# Patient Record
Sex: Female | Born: 1975
Health system: Southern US, Community
[De-identification: ages and names within clinical notes are randomized; demographics above are authoritative.]

## PROBLEM LIST (undated history)

## (undated) DIAGNOSIS — E559 Vitamin D deficiency, unspecified: Secondary | ICD-10-CM

## (undated) DIAGNOSIS — M1990 Unspecified osteoarthritis, unspecified site: Secondary | ICD-10-CM

## (undated) DIAGNOSIS — N816 Rectocele: Secondary | ICD-10-CM

## (undated) DIAGNOSIS — T7840XA Allergy, unspecified, initial encounter: Secondary | ICD-10-CM

## (undated) HISTORY — DX: Rectocele: N81.6

## (undated) HISTORY — DX: Unspecified osteoarthritis, unspecified site: M19.90

## (undated) HISTORY — PX: REPAIR ANKLE LIGAMENT: SUR1187

## (undated) HISTORY — DX: Vitamin D deficiency, unspecified: E55.9

## (undated) HISTORY — DX: Allergy, unspecified, initial encounter: T78.40XA

---

## 2016-12-01 DIAGNOSIS — Z01419 Encounter for gynecological examination (general) (routine) without abnormal findings: Secondary | ICD-10-CM | POA: Diagnosis not present

## 2016-12-20 DIAGNOSIS — N6310 Unspecified lump in the right breast, unspecified quadrant: Secondary | ICD-10-CM | POA: Diagnosis not present

## 2017-01-12 DIAGNOSIS — Z0000 Encounter for general adult medical examination without abnormal findings: Secondary | ICD-10-CM | POA: Diagnosis not present

## 2017-01-12 DIAGNOSIS — Z13220 Encounter for screening for lipoid disorders: Secondary | ICD-10-CM | POA: Diagnosis not present

## 2017-01-12 DIAGNOSIS — R1011 Right upper quadrant pain: Secondary | ICD-10-CM | POA: Diagnosis not present

## 2017-01-12 DIAGNOSIS — Z23 Encounter for immunization: Secondary | ICD-10-CM | POA: Diagnosis not present

## 2017-01-12 DIAGNOSIS — R1010 Upper abdominal pain, unspecified: Secondary | ICD-10-CM | POA: Diagnosis not present

## 2017-02-11 DIAGNOSIS — B370 Candidal stomatitis: Secondary | ICD-10-CM | POA: Diagnosis not present

## 2017-06-13 DIAGNOSIS — Z23 Encounter for immunization: Secondary | ICD-10-CM | POA: Diagnosis not present

## 2017-07-21 DIAGNOSIS — D649 Anemia, unspecified: Secondary | ICD-10-CM | POA: Diagnosis not present

## 2017-07-21 DIAGNOSIS — R1011 Right upper quadrant pain: Secondary | ICD-10-CM | POA: Diagnosis not present

## 2017-07-25 ENCOUNTER — Other Ambulatory Visit: Payer: Self-pay | Admitting: Family Medicine

## 2017-07-25 DIAGNOSIS — E041 Nontoxic single thyroid nodule: Secondary | ICD-10-CM

## 2017-07-25 DIAGNOSIS — R1011 Right upper quadrant pain: Secondary | ICD-10-CM

## 2017-07-28 ENCOUNTER — Ambulatory Visit
Admission: RE | Admit: 2017-07-28 | Discharge: 2017-07-28 | Disposition: A | Discharge status: Home / Self Care | Payer: 59 | Source: Ambulatory Visit | Attending: Family Medicine | Admitting: Family Medicine

## 2017-07-28 DIAGNOSIS — R1011 Right upper quadrant pain: Secondary | ICD-10-CM

## 2017-08-25 DIAGNOSIS — D649 Anemia, unspecified: Secondary | ICD-10-CM | POA: Diagnosis not present

## 2017-10-02 DIAGNOSIS — D649 Anemia, unspecified: Secondary | ICD-10-CM | POA: Diagnosis not present

## 2018-01-01 IMAGING — US US ABDOMEN LIMITED
1 series · 14 of 25 positions shown · non-contrast
Comparison: None.

CLINICAL DATA: Right upper quadrant pain over the last 8 months.

EXAM:
ULTRASOUND ABDOMEN LIMITED RIGHT UPPER QUADRANT

[Series 1: us abdomen limited · 0.20mm/px · 14 of 43 slices shown]
[im 1/43]
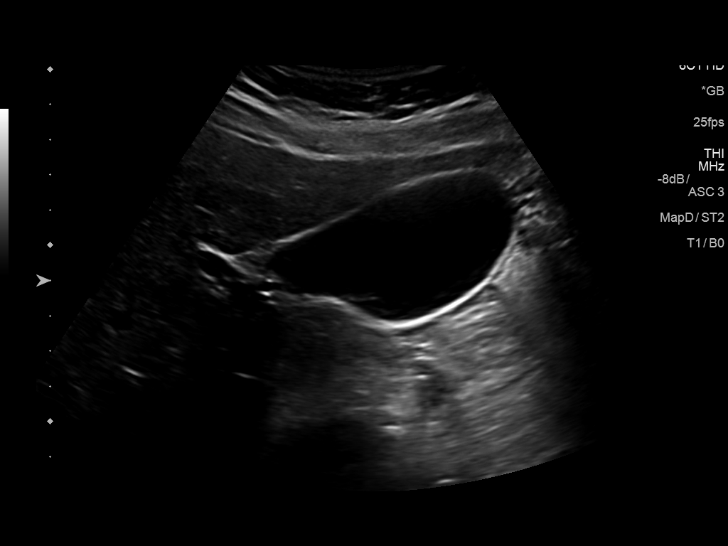
[im 4/43]
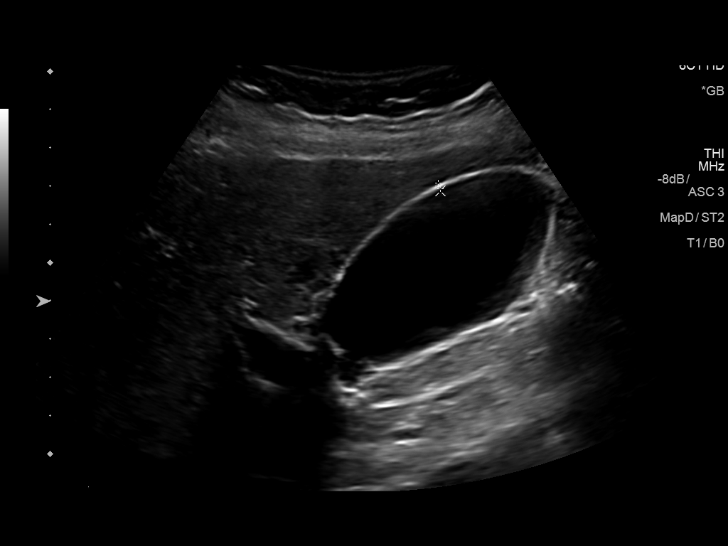
[im 8/43]
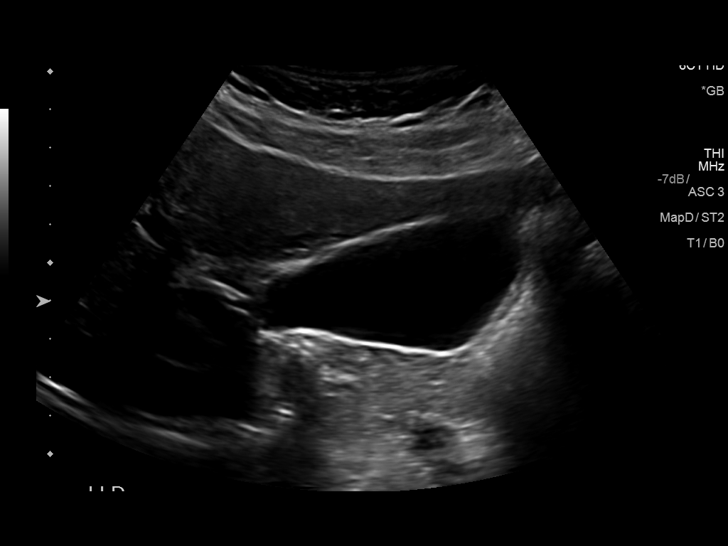
[im 11/43]
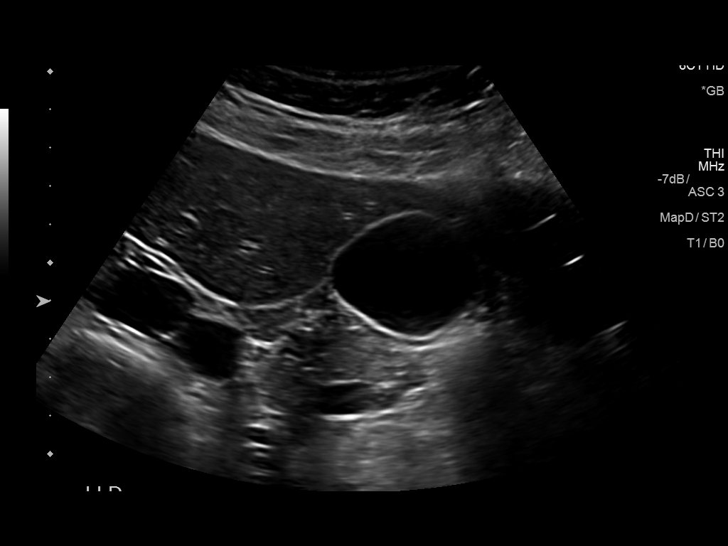
[im 15/43]
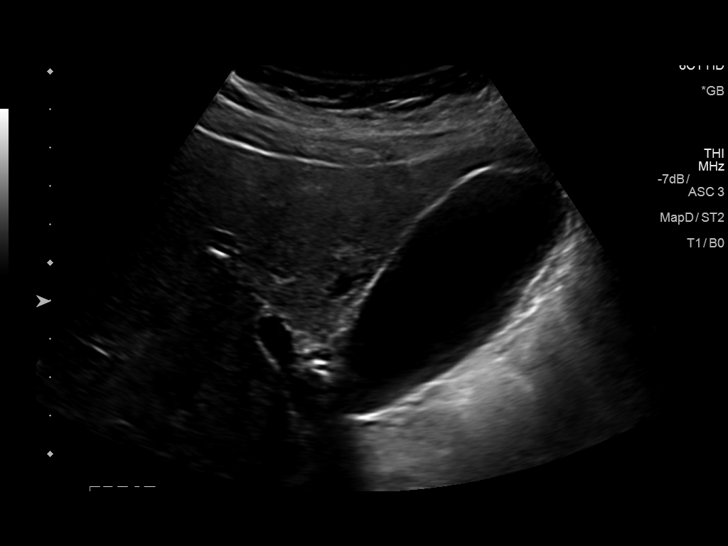
[im 16/43]
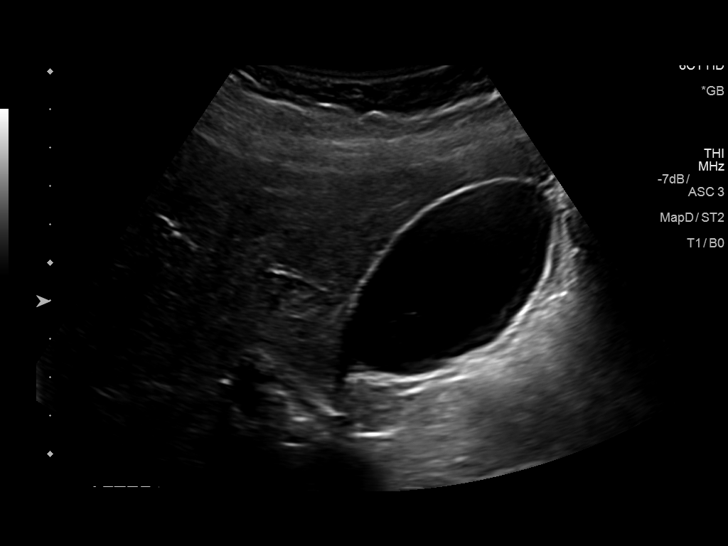
[im 20/43]
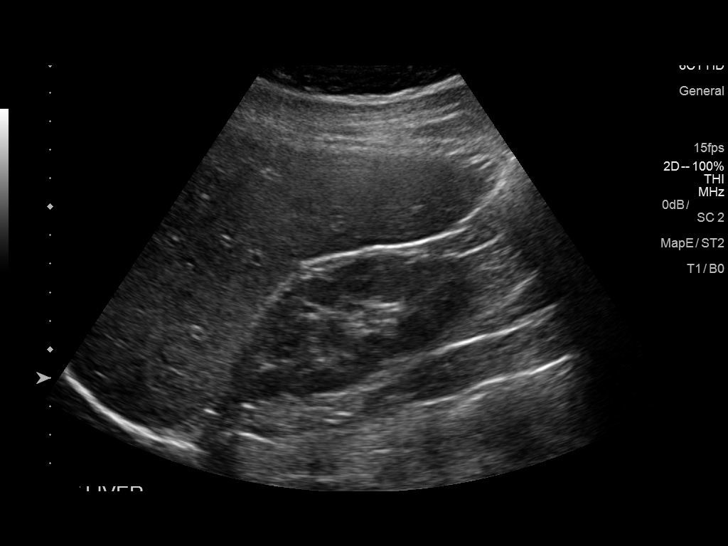
[im 23/43]
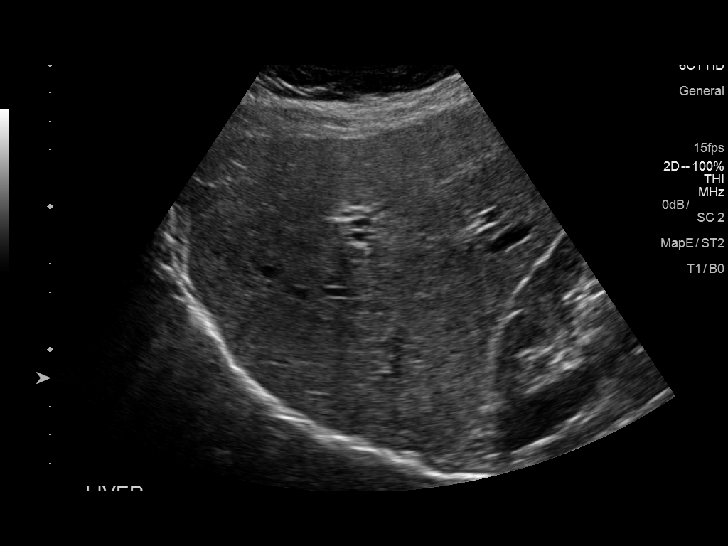
[im 27/43]
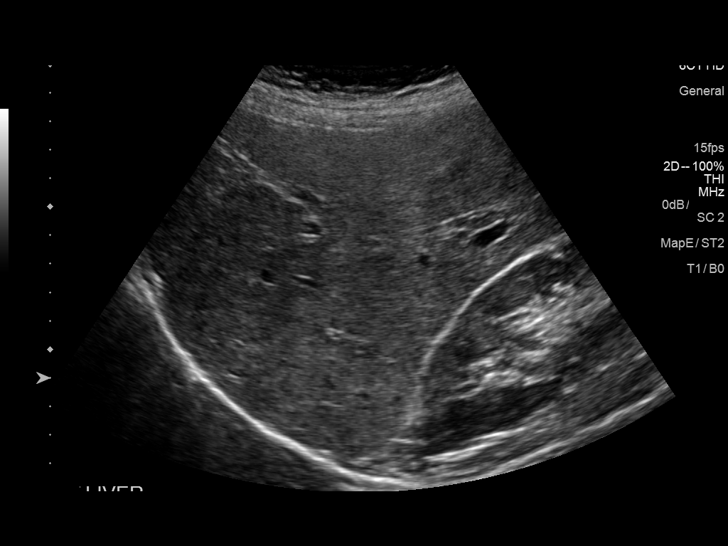
[im 29/43]
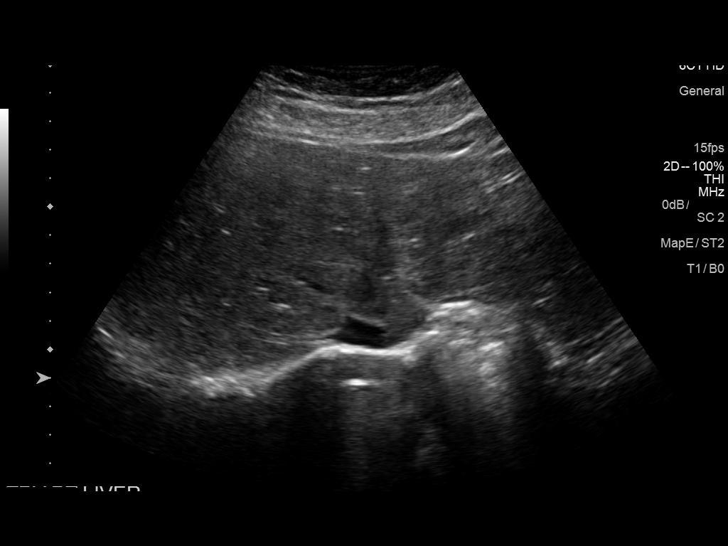
[im 32/43]
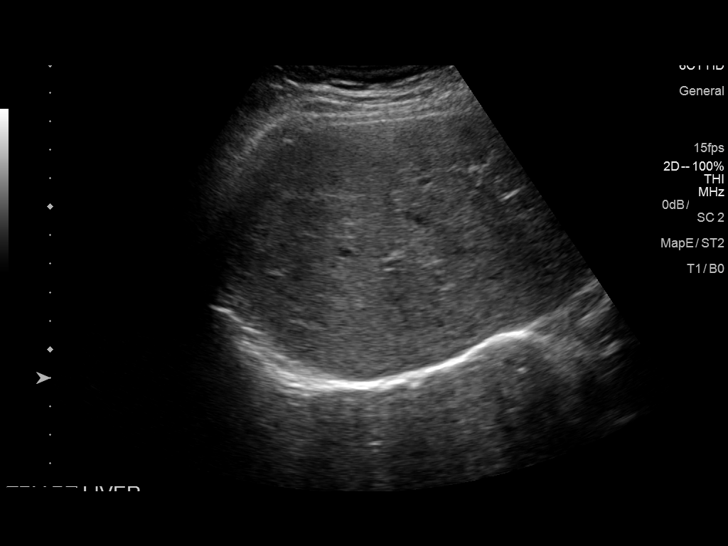
[im 36/43]
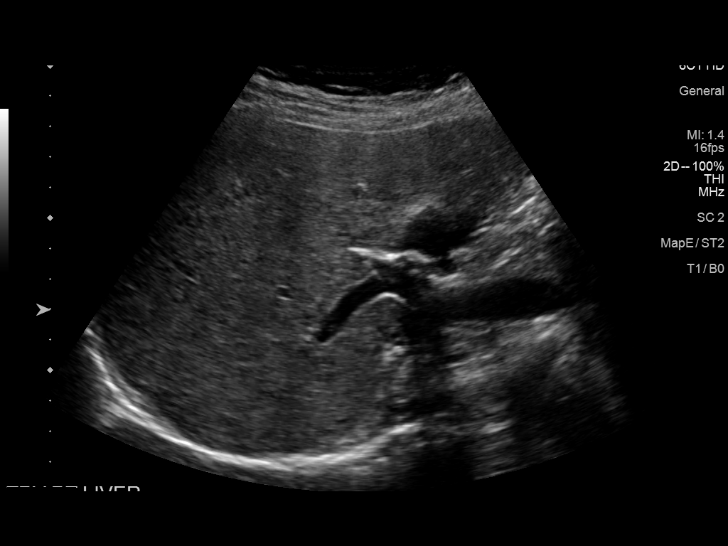
[im 39/43]
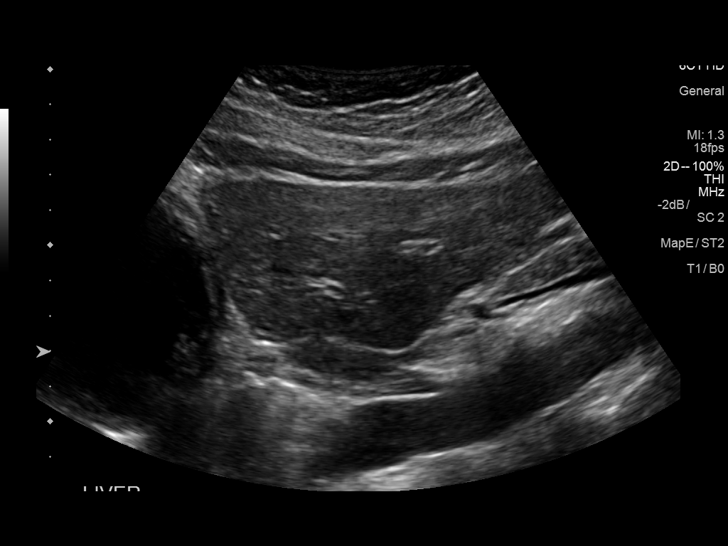
[im 43/43]
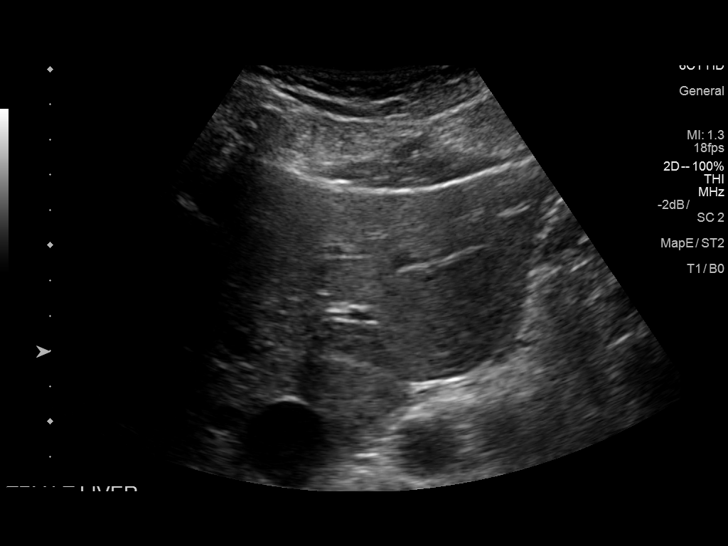

[14 of 25 positions shown; findings below may reference images not displayed]

FINDINGS: Gallbladder:

No gallstones or wall thickening visualized. No sonographic Murphy
sign noted by sonographer.

Common bile duct:

Diameter: 3 mm common normal

Liver:

No focal lesion identified. Within normal limits in parenchymal
echogenicity. Portal vein is patent on color Doppler imaging with
normal direction of blood flow towards the liver.
IMPRESSION: Normal right upper quadrant ultrasound. No cause of pain identified.

## 2018-01-02 DIAGNOSIS — Z01419 Encounter for gynecological examination (general) (routine) without abnormal findings: Secondary | ICD-10-CM | POA: Diagnosis not present

## 2018-01-17 DIAGNOSIS — Z0000 Encounter for general adult medical examination without abnormal findings: Secondary | ICD-10-CM | POA: Diagnosis not present

## 2018-01-17 DIAGNOSIS — D649 Anemia, unspecified: Secondary | ICD-10-CM | POA: Diagnosis not present

## 2018-01-17 DIAGNOSIS — Z13220 Encounter for screening for lipoid disorders: Secondary | ICD-10-CM | POA: Diagnosis not present

## 2018-08-07 DIAGNOSIS — R05 Cough: Secondary | ICD-10-CM | POA: Diagnosis not present

## 2018-08-07 DIAGNOSIS — J0100 Acute maxillary sinusitis, unspecified: Secondary | ICD-10-CM | POA: Diagnosis not present

## 2018-08-24 ENCOUNTER — Other Ambulatory Visit: Payer: Self-pay | Admitting: Family Medicine

## 2018-09-19 NOTE — Progress Notes (Signed)
Sharon ScaleZach West D.O. [facility] [address] [place], [place] [address] Phone: [phone] Subjective:   Sharon West, Sharon West, am serving as a scribe for Dr. Antoine PrimasZachary West.  I'm seeing this patient by the request  of:    CC: Right foot and heel pain  VHQ:[id number]:Subjective  Loistine SimasJohanna West is a 43 y.o. female coming in with complaint of right heel pain for one year. Patient began to play tennis and acquired pain. Pain is localized. Has been stretching gastroc, uses OOFOS in the house, rolls the fascia, and is using OTC orthotics. Has not been active due to pain but has tried walking around neighborhood. Does have swelling in the area if she has been on her feet. Tries not to use IBU.       History reviewed. No pertinent past medical history. History reviewed. No pertinent surgical history. Social History   Socioeconomic History  . Marital status: Married    Spouse name: Not on file  . Number of children: Not on file  . Years of education: Not on file  . Highest education level: Not on file  Occupational History  . Not on file  Social Needs  . Financial resource strain: Not on file  . Food insecurity:    Worry: Not on file    Inability: Not on file  . Transportation needs:    Medical: Not on file    Non-medical: Not on file  Tobacco Use  . Smoking status: Not on file  Substance and Sexual Activity  . Alcohol use: Not on file  . Drug use: Not on file  . Sexual activity: Not on file  Lifestyle  . Physical activity:    Days per week: Not on file    Minutes per session: Not on file  . Stress: Not on file  Relationships  . Social connections:    Talks on phone: Not on file    Gets together: Not on file    Attends religious service: Not on file    Active member of club or organization: Not on file    Attends meetings of clubs or organizations: Not on file    Relationship status: Not on file  Other Topics Concern  . Not on file  Social History Narrative  . Not on file    Not on File History reviewed. No pertinent family history.   Current Outpatient Medications (Cardiovascular):  .  nitroGLYCERIN (NITRODUR - DOSED IN MG/24 HR) 0.2 mg/hr patch, 1/4 patch daily     Current Outpatient Medications (Other):  Marland Kitchen.  Vitamin D, Cholecalciferol, 25 MCG (1000 UT) TABS, Take by mouth. .  Vitamin D, Ergocalciferol, (DRISDOL) 1.25 MG (50000 UT) CAPS capsule, Take 1 capsule (50,000 Units total) by mouth every 7 (seven) days.    Past medical history, social, surgical and family history all reviewed in electronic medical record.  No pertanent information unless stated regarding to the chief complaint.   Review of Systems:  No headache, visual changes, nausea, vomiting, diarrhea, constipation, dizziness, abdominal pain, skin rash, fevers, chills, night sweats, weight loss, swollen lymph nodes, body aches, joint swelling, , chest pain, shortness of breath, mood changes.  Positive muscle aches  Objective  Blood pressure 112/74, pulse 80, height 5\' 6"  (1.676 m), weight 179 lb (81.2 kg), SpO2 98 %.   General: No apparent distress alert and oriented x3 mood and affect normal, dressed appropriately.  HEENT: Pupils equal, extraocular movements intact  Respiratory: Patient's speak in full sentences and does  not appear short of breath  Cardiovascular: No lower extremity edema, non tender, no erythema  Skin: Warm dry intact with no signs of infection or rash on extremities or on axial skeleton.  Abdomen: Soft nontender  Neuro: Cranial nerves II through XII are intact, neurovascularly intact in all extremities with 2+ DTRs and 2+ pulses.  Lymph: No lymphadenopathy of posterior or anterior cervical chain or axillae bilaterally.  Gait normal with good balance and coordination.  MSK:  Non tender with full range of motion and good stability and symmetric strength and tone of shoulders, elbows, wrist, hip, knee bilaterally.  Ankle: Right No visible erythema or swelling. Range of  motion is full in all directions. Strength is 5/5 in all directions. Stable lateral and medial ligaments; squeeze test and kleiger test unremarkable; Talar dome nontender; No pain at base of 5th MT; No tenderness over cuboid; No tenderness over N spot or navicular prominence No tenderness on posterior aspects of lateral and medial malleolus No sign of peroneal tendon subluxations or tenderness to palpation Mild positive tarsal tunnel tinel's Able to walk 4 steps.  MSK US performed of: Right ankle This study was ordered, performed, and interpreted by Sharon West D.O.  Foot/Ankle:   All structures visualized.   Ankle mortise is unremarkable.  Posterior tibialis very trace effusion within the tendon sheath. Patient does have what appears to be a small stress reaction versus possible small cortical defect with callus feeling over the calcaneal region on the plantar aspect.  Mild enlargement of the plantar fascial knee noted.  Possible partial tear of the plantar fascia also noted  IMPRESSION: calcanealstress fracture    Impression and Recommendations:     This case required medical decision making of moderate complexity. The above documentation has been reviewed and is accurate and complete Sharon SaaZachary M Smith, DO       Note: This dictation was prepared with Dragon dictation along with smaller phrase technology. Any transcriptional errors that result from this process are unintentional.

## 2018-09-20 ENCOUNTER — Encounter: Payer: Self-pay | Admitting: Family Medicine

## 2018-09-20 ENCOUNTER — Ambulatory Visit: Payer: Self-pay

## 2018-09-20 ENCOUNTER — Ambulatory Visit (INDEPENDENT_AMBULATORY_CARE_PROVIDER_SITE_OTHER): Payer: 59 | Admitting: Family Medicine

## 2018-09-20 VITALS — BP 112/74 | HR 80 | Ht 66.0 in | Wt 179.0 lb

## 2018-09-20 DIAGNOSIS — S92001A Unspecified fracture of right calcaneus, initial encounter for closed fracture: Secondary | ICD-10-CM

## 2018-09-20 DIAGNOSIS — M79671 Pain in right foot: Secondary | ICD-10-CM | POA: Diagnosis not present

## 2018-09-20 MED ORDER — VITAMIN D (ERGOCALCIFEROL) 1.25 MG (50000 UNIT) PO CAPS: 50000.0000 [IU] | ORAL_CAPSULE | ORAL | 0 refills | Status: DC

## 2018-09-20 MED ORDER — NITROGLYCERIN 0.2 MG/HR TD PT24: MEDICATED_PATCH | TRANSDERMAL | 1 refills | Status: DC

## 2018-09-20 NOTE — Assessment & Plan Note (Signed)
Appears to be more of a calcaneal fracture.  Seems to be more of a stress reaction.  Does have some healing.  Start vitamin D, discussed over-the-counter orthotics, discussed a heel doughnut.  We discussed avoiding high impact exercises for now.  Start nitroglycerin avoid potential side effects including headaches.  Follow-up again in 4 weeks

## 2018-09-20 NOTE — Patient Instructions (Signed)
God to see you  Ice is yrou friend if you want but not more then 10 minutes at a time Avoid being barefoot.  Spenco orthotics "total support" online would be great  Once weekly vitamin D for 12 weeks, stop daily  Get K2 over the counter and take daily for 1 month Nitroglycerin Protocol   Apply 1/4 nitroglycerin patch to affected area daily.  Change position of patch within the affected area every 24 hours.  You may experience a headache during the first 1-2 weeks of using the patch, these should subside.  If you experience headaches after beginning nitroglycerin patch treatment, you may take your preferred over the counter pain reliever.  Another side effect of the nitroglycerin patch is skin irritation or rash related to patch adhesive.  Please notify our office if you develop more severe headaches or rash, and stop the patch.  Tendon healing with nitroglycerin patch may require 12 to 24 weeks depending on the extent of injury.  Men should not use if taking Viagra, Cialis, or Levitra.   Do not use if you have migraines or rosacea.  Heel donut could be nice as well  We will watch the nerve as well  See me again in 4 weeks

## 2018-10-18 ENCOUNTER — Ambulatory Visit: Payer: Self-pay

## 2018-10-18 ENCOUNTER — Encounter: Payer: Self-pay | Admitting: Family Medicine

## 2018-10-18 ENCOUNTER — Ambulatory Visit (INDEPENDENT_AMBULATORY_CARE_PROVIDER_SITE_OTHER): Payer: 59 | Admitting: Family Medicine

## 2018-10-18 ENCOUNTER — Other Ambulatory Visit: Payer: Self-pay

## 2018-10-18 VITALS — BP 102/64 | HR 86 | Ht 66.0 in | Wt 178.0 lb

## 2018-10-18 DIAGNOSIS — S92001D Unspecified fracture of right calcaneus, subsequent encounter for fracture with routine healing: Secondary | ICD-10-CM

## 2018-10-18 DIAGNOSIS — M79671 Pain in right foot: Secondary | ICD-10-CM

## 2018-10-18 MED ORDER — DICLOFENAC SODIUM 2 % TD SOLN: 2.0000 g | Freq: Two times a day (BID) | TRANSDERMAL | 3 refills | Status: DC

## 2018-10-18 MED ORDER — VITAMIN D (ERGOCALCIFEROL) 1.25 MG (50000 UNIT) PO CAPS: 50000.0000 [IU] | ORAL_CAPSULE | ORAL | 0 refills | Status: DC

## 2018-10-18 NOTE — Assessment & Plan Note (Signed)
Healing noted, patient does have signs and symptoms that are consistent with some tarsal tunnel syndrome, seems to be more compression of a varicose vein, we discussed compression socks, home exercises, discussed topical anti-inflammatories.  Discussed burst use of anti-inflammatories when needed.  Icing regimen, patient will try to continue to make these changes and see me again in 6 weeks

## 2018-10-18 NOTE — Progress Notes (Signed)
[name] [address] [place], [place] [address] Phone: [phone] Subjective:     CC: Foot pain follow-up  OVF:[id number]   I, Wilford Grist, am serving as a scribe for Dr. Antoine Primas.   10/18/2018: Appears to be more of a calcaneal fracture.  Seems to be more of a stress reaction.  Does have some healing.  Start vitamin D, discussed over-the-counter orthotics, discussed a heel doughnut.  We discussed avoiding high impact exercises for now.  Start nitroglycerin avoid potential side effects including headaches.  Follow-up again in 4 weeks  Update 10/18/2018: Sharon West is a 43 y.o. female coming in with complaint of right heel pain. Patient states that her pain is less than last visit. Is having more nerve pain daily. Working out increases the burning in her arch. Does also note some swelling in that area. Did get new shoes and arch supports.       No past medical history on file. No past surgical history on file. Social History   Socioeconomic History  . Marital status: Married    Spouse name: Not on file  . Number of children: Not on file  . Years of education: Not on file  . Highest education level: Not on file  Occupational History  . Not on file  Social Needs  . Financial resource strain: Not on file  . Food insecurity:    Worry: Not on file    Inability: Not on file  . Transportation needs:    Medical: Not on file    Non-medical: Not on file  Tobacco Use  . Smoking status: Not on file  Substance and Sexual Activity  . Alcohol use: Not on file  . Drug use: Not on file  . Sexual activity: Not on file  Lifestyle  . Physical activity:    Days per week: Not on file    Minutes per session: Not on file  . Stress: Not on file  Relationships  . Social connections:    Talks on phone: Not on file    Gets together: Not on file    Attends religious service: Not on file    Active member of club or organization: Not on file   Attends meetings of clubs or organizations: Not on file    Relationship status: Not on file  Other Topics Concern  . Not on file  Social History Narrative  . Not on file   Not on File No family history on file.   Current Outpatient Medications (Cardiovascular):  .  nitroGLYCERIN (NITRODUR - DOSED IN MG/24 HR) 0.2 mg/hr patch, 1/4 patch daily     Current Outpatient Medications (Other):  Marland Kitchen  Diclofenac Sodium (PENNSAID) 2 % SOLN, Place 2 g onto the skin 2 (two) times daily. .  Vitamin D, Cholecalciferol, 25 MCG (1000 UT) TABS, Take by mouth. .  Vitamin D, Ergocalciferol, (DRISDOL) 1.25 MG (50000 UT) CAPS capsule, Take 1 capsule (50,000 Units total) by mouth every 7 (seven) days.    Past medical history, social, surgical and family history all reviewed in electronic medical record.  No pertanent information unless stated regarding to the chief complaint.   Review of Systems:  No headache, visual changes, nausea, vomiting, diarrhea, constipation, dizziness, abdominal pain, skin rash, fevers, chills, night sweats, weight loss, swollen lymph nodes, body aches, joint swelling, muscle aches, chest pain, shortness of breath, mood changes.  Positive muscle aches  Objective  Blood pressure 102/64, pulse 86, height 5'  6" (1.676 m), weight 178 lb (80.7 kg), SpO2 99 %.    General: No apparent distress alert and oriented x3 mood and affect normal, dressed appropriately.  HEENT: Pupils equal, extraocular movements intact  Respiratory: Patient's speak in full sentences and does not appear short of breath  Cardiovascular: No lower extremity edema, non tender, no erythema  Skin: Warm dry intact with no signs of infection or rash on extremities or on axial skeleton.  Abdomen: Soft nontender  Neuro: Cranial nerves II through XII are intact, neurovascularly intact in all extremities with 2+ DTRs and 2+ pulses.  Lymph: No lymphadenopathy of posterior or anterior cervical chain or axillae bilaterally.   Gait normal with good balance and coordination.  MSK:  Non tender with full range of motion and good stability and symmetric strength and tone of shoulders, elbows, wrist, hip, knee and ankles bilaterally.  Foot exam: Shows the patient does have very mild breakdown longitudinal arch.  Still mild tenderness over the calcaneal region, mild positive Tinel's over the tarsal tunnel on the right side.    Limited musculoskeletal ultrasound was performed and interpreted by Judi Saa   Limited ultrasound shows the patient's calcaneal region does have good callus formation noted.  Patient does have what appears to be some inflammation in the varicose vein that seems to transverse the tarsal tunnel that is likely contributing to some of the discomfort and pain. Impression and Recommendations:     This case required medical decision making of moderate complexity. The above documentation has been reviewed and is accurate and complete Judi Saa, DO       Note: This dictation was prepared with Dragon dictation along with smaller phrase technology. Any transcriptional errors that result from this process are unintentional.

## 2018-10-18 NOTE — Patient Instructions (Signed)
Varicose vein possibly contributing  The calcaneus is improving a lot Refill the vitami nD for at least another r4 week pennsaid pinkie amount topically 2 times daily as needed.   Compression socks 10-21mMhg Up to you on the nitro and listen to your body  Avoid being barefoot still another month  See me again in 6 weeks if not perfect

## 2018-12-04 ENCOUNTER — Ambulatory Visit: Payer: 59 | Admitting: Family Medicine

## 2019-01-08 DIAGNOSIS — Z1231 Encounter for screening mammogram for malignant neoplasm of breast: Secondary | ICD-10-CM | POA: Diagnosis not present

## 2019-01-25 DIAGNOSIS — D649 Anemia, unspecified: Secondary | ICD-10-CM | POA: Diagnosis not present

## 2019-01-25 DIAGNOSIS — M8430XA Stress fracture, unspecified site, initial encounter for fracture: Secondary | ICD-10-CM | POA: Diagnosis not present

## 2019-01-25 DIAGNOSIS — Z0000 Encounter for general adult medical examination without abnormal findings: Secondary | ICD-10-CM | POA: Diagnosis not present

## 2019-01-25 DIAGNOSIS — Z13220 Encounter for screening for lipoid disorders: Secondary | ICD-10-CM | POA: Diagnosis not present

## 2019-07-16 DIAGNOSIS — Z1151 Encounter for screening for human papillomavirus (HPV): Secondary | ICD-10-CM | POA: Diagnosis not present

## 2019-07-16 DIAGNOSIS — Z01419 Encounter for gynecological examination (general) (routine) without abnormal findings: Secondary | ICD-10-CM | POA: Diagnosis not present

## 2019-07-16 DIAGNOSIS — Z6829 Body mass index (BMI) 29.0-29.9, adult: Secondary | ICD-10-CM | POA: Diagnosis not present

## 2019-07-16 DIAGNOSIS — Z124 Encounter for screening for malignant neoplasm of cervix: Secondary | ICD-10-CM | POA: Diagnosis not present

## 2020-02-06 DIAGNOSIS — Z13220 Encounter for screening for lipoid disorders: Secondary | ICD-10-CM | POA: Diagnosis not present

## 2020-02-06 DIAGNOSIS — D649 Anemia, unspecified: Secondary | ICD-10-CM | POA: Diagnosis not present

## 2020-02-06 DIAGNOSIS — R5383 Other fatigue: Secondary | ICD-10-CM | POA: Diagnosis not present

## 2020-02-06 DIAGNOSIS — Z0000 Encounter for general adult medical examination without abnormal findings: Secondary | ICD-10-CM | POA: Diagnosis not present

## 2020-02-06 DIAGNOSIS — R7309 Other abnormal glucose: Secondary | ICD-10-CM | POA: Diagnosis not present

## 2020-02-06 DIAGNOSIS — E559 Vitamin D deficiency, unspecified: Secondary | ICD-10-CM | POA: Diagnosis not present

## 2020-03-26 DIAGNOSIS — Z1231 Encounter for screening mammogram for malignant neoplasm of breast: Secondary | ICD-10-CM | POA: Diagnosis not present

## 2020-03-28 DIAGNOSIS — Z20822 Contact with and (suspected) exposure to covid-19: Secondary | ICD-10-CM | POA: Diagnosis not present

## 2020-04-23 DIAGNOSIS — L0100 Impetigo, unspecified: Secondary | ICD-10-CM | POA: Diagnosis not present

## 2020-05-28 DIAGNOSIS — L814 Other melanin hyperpigmentation: Secondary | ICD-10-CM | POA: Diagnosis not present

## 2020-05-28 DIAGNOSIS — L570 Actinic keratosis: Secondary | ICD-10-CM | POA: Diagnosis not present

## 2020-05-28 DIAGNOSIS — L820 Inflamed seborrheic keratosis: Secondary | ICD-10-CM | POA: Diagnosis not present

## 2020-05-28 DIAGNOSIS — L578 Other skin changes due to chronic exposure to nonionizing radiation: Secondary | ICD-10-CM | POA: Diagnosis not present

## 2020-05-28 DIAGNOSIS — D1801 Hemangioma of skin and subcutaneous tissue: Secondary | ICD-10-CM | POA: Diagnosis not present

## 2020-06-19 ENCOUNTER — Other Ambulatory Visit: Payer: Self-pay

## 2020-06-19 ENCOUNTER — Ambulatory Visit (INDEPENDENT_AMBULATORY_CARE_PROVIDER_SITE_OTHER): Payer: BC Managed Care – PPO | Admitting: Family Medicine

## 2020-06-19 VITALS — BP 122/62 | HR 87 | Ht 66.0 in | Wt 182.0 lb

## 2020-06-19 DIAGNOSIS — S39012A Strain of muscle, fascia and tendon of lower back, initial encounter: Secondary | ICD-10-CM | POA: Diagnosis not present

## 2020-06-19 MED ORDER — PREDNISONE 50 MG PO TABS: 50.0000 mg | ORAL_TABLET | Freq: Every day | ORAL | 0 refills | Status: DC

## 2020-06-19 MED ORDER — TIZANIDINE HCL 4 MG PO TABS: 4.0000 mg | ORAL_TABLET | Freq: Four times a day (QID) | ORAL | 1 refills | Status: DC | PRN

## 2020-06-19 NOTE — Patient Instructions (Addendum)
Thank you for coming in today.  Plan for PT and dry needling. Heat and TENS unit.   If you are super miserable this weekend and the prednisone and tizanidine do not help text me [phone]. I will prescribe opiates.   Suspect QL

## 2020-06-19 NOTE — Progress Notes (Signed)
   I, Philbert Riser, LAT, ATC, am serving as scribe for Dr. Clementeen Graham.  Sharon West is a 44 y.o. female who presents to [facility] at [facility] today for low back pain.  MOI: chronic tightness that acutely flared up. No PMHx of LBP. Pt unable to sit comfortably and prefers standing and called off work today.  Radiating pain: radiating in the L buttock and Lhip LE numbness/tingling: no LE weakness: inhibition weakness Aggravating factors: sitting, bending, marching (putting pants on, shoes on). Treatments tried:ice, advil, c no relief. Laying on R side helped c self- traction   Pertinent review of systems: No fevers or chills  Relevant historical information: History of calcaneus stress fracture in the past.  Works as a Adult nurse palpation.   Exam:  BP 122/62 (BP Location: Right Arm, Patient Position: Standing, Cuff Size: Normal)   Pulse 87   Ht 5\' 6"  (1.676 m)   Wt 182 lb (82.6 kg)   SpO2 99%   BMI 29.38 kg/m  General: Well Developed, well nourished, and in no acute distress.   MSK: L-spine normal-appearing nontender midline.  Tender palpation left lumbar paraspinal musculature and into SI joint region. Decreased lumbar motion. Left hip normal.  Nontender.  Normal hip strength.  Normal hip motion.     Assessment and Plan: 44 y.o. female with left low back pain likely due to quadratus lumborum spasm and dysfunction.  Pain was very severe last night rated 9 out of 10 but improving now.  Plan to treat with physical therapy which she can access on her own.  Additionally have prescribed tizanidine to use as needed.  Backup prescription of prednisone to use over the weekend if severe or severe.  Patient will work on home exercise program and now and physical therapy.  Recheck back if not improving.  Would consider SI joint injection and x-rays etc.   PDMP not reviewed this encounter. No orders of the defined types were placed in this  encounter.  Meds ordered this encounter  Medications  . predniSONE (DELTASONE) 50 MG tablet    Sig: Take 1 tablet (50 mg total) by mouth daily.    Dispense:  5 tablet    Refill:  0  . tiZANidine (ZANAFLEX) 4 MG tablet    Sig: Take 1 tablet (4 mg total) by mouth every 6 (six) hours as needed for muscle spasms.    Dispense:  30 tablet    Refill:  1     Discussed warning signs or symptoms. Please see discharge instructions. Patient expresses understanding.   The above documentation has been reviewed and is accurate and complete 56, M.D.

## 2020-10-20 ENCOUNTER — Ambulatory Visit (INDEPENDENT_AMBULATORY_CARE_PROVIDER_SITE_OTHER): Payer: BC Managed Care – PPO | Admitting: Family Medicine

## 2020-10-20 ENCOUNTER — Other Ambulatory Visit: Payer: Self-pay

## 2020-10-20 ENCOUNTER — Ambulatory Visit (INDEPENDENT_AMBULATORY_CARE_PROVIDER_SITE_OTHER): Payer: BC Managed Care – PPO

## 2020-10-20 ENCOUNTER — Encounter: Payer: Self-pay | Admitting: Family Medicine

## 2020-10-20 VITALS — BP 128/80 | HR 81 | Ht 66.0 in | Wt 189.0 lb

## 2020-10-20 DIAGNOSIS — M999 Biomechanical lesion, unspecified: Secondary | ICD-10-CM | POA: Diagnosis not present

## 2020-10-20 DIAGNOSIS — M542 Cervicalgia: Secondary | ICD-10-CM | POA: Diagnosis not present

## 2020-10-20 MED ORDER — PREDNISONE 20 MG PO TABS: 40.0000 mg | ORAL_TABLET | Freq: Every day | ORAL | 0 refills | Status: DC

## 2020-10-20 MED ORDER — PREDNISONE 20 MG PO TABS: 20.0000 mg | ORAL_TABLET | Freq: Every day | ORAL | 0 refills | Status: DC

## 2020-10-20 NOTE — Assessment & Plan Note (Signed)
   Decision today to treat with OMT was based on Physical Exam  After verbal consent patient was treated with  ME, FPR techniques in cervical, thoracic, rib areas, all areas are chronic   Patient tolerated the procedure well with improvement in symptoms  Patient given exercises, stretches and lifestyle modifications  See medications in patient instructions if given  Patient will follow up in 4 weeks

## 2020-10-20 NOTE — Patient Instructions (Addendum)
Good to see you Neck xray Neck exercises 3 times a a week Prednisone 40 mg for 5 days starting tomorrow Take zanaflex nighty through the weekend Ice, tens, whatever you want See me again in 3-4 weeks

## 2020-10-20 NOTE — Assessment & Plan Note (Signed)
Appears to be acute on chronic.  We will get x-rays to further evaluate with patient stating she has had exacerbations of this previously.  Prednisone given, Zanaflex encourage at nighttime.  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  Worsening pain or radicular symptoms patient is to return sooner.  Attempted some very mild osteopathic manipulation of the thoracic spine and stretching of the cervical spine.  Follow-up with me again 4 weeks

## 2020-10-20 NOTE — Progress Notes (Signed)
[facility] [address] [place] [address] Phone: [phone] Subjective:   I Sharon West am serving as a Neurosurgeon for Dr. Antoine Primas.  This visit occurred during the SARS-CoV-2 public health emergency.  Safety protocols were in place, including screening questions prior to the visit, additional usage of staff PPE, and extensive cleaning of exam room while observing appropriate contact time as indicated for disinfecting solutions.   I'm seeing this patient by the request  of:  Farris Has, MD  CC: Neck and shoulder pain  HCW:[id number]  Sharon West is a 45 y.o. female coming in with complaint of neck pain. Last seen in 2020 for heel pain. Patient states the neck pain keeps her up at night. ROM is limited in the morning and gets better as the day goes on. History of trauma to the neck but has never had any imaging. States her neck makes a lot of noise and when it gets bad it sounds like sand and grit.   Onset- 10-14 days Location - base of neck  Duration- chronic  Character- radiates from the neck to shoulder blades  Aggravating factors- all ROM  Reliving factors-  Therapies tried- ice, heat, topical and oral mediations (prednisone, muscle relaxer) Severity- 7-8/10 in the morning     No past medical history on file. No past surgical history on file. Social History   Socioeconomic History  . Marital status: Married    Spouse name: Not on file  . Number of children: Not on file  . Years of education: Not on file  . Highest education level: Not on file  Occupational History  . Not on file  Tobacco Use  . Smoking status: Not on file  . Smokeless tobacco: Not on file  Substance and Sexual Activity  . Alcohol use: Not on file  . Drug use: Not on file  . Sexual activity: Not on file  Other Topics Concern  . Not on file  Social History Narrative  . Not on file   Social Determinants of Health   Financial Resource Strain: Not  on file  Food Insecurity: Not on file  Transportation Needs: Not on file  Physical Activity: Not on file  Stress: Not on file  Social Connections: Not on file   Allergies  Allergen Reactions  . Penicillins    No family history on file.  Current Outpatient Medications (Endocrine & Metabolic):  .  predniSONE (DELTASONE) 50 MG tablet, Take 1 tablet (50 mg total) by mouth daily. .  predniSONE (DELTASONE) 20 MG tablet, Take 2 tablets (40 mg total) by mouth daily with breakfast.      Current Outpatient Medications (Other):  .  tiZANidine (ZANAFLEX) 4 MG tablet, Take 1 tablet (4 mg total) by mouth every 6 (six) hours as needed for muscle spasms.   Reviewed prior external information including notes and imaging from  primary care provider As well as notes that were available from care everywhere and other healthcare systems.  Past medical history, social, surgical and family history all reviewed in electronic medical record.  No pertanent information unless stated regarding to the chief complaint.   Review of Systems:  No headache, visual changes, nausea, vomiting, diarrhea, constipation, dizziness, abdominal pain, skin rash, fevers, chills, night sweats, weight loss, swollen lymph nodes, body aches, joint swelling, chest pain, shortness of breath, mood changes. POSITIVE muscle aches  Objective  Blood pressure 128/80, pulse 81, height 5\' 6"  (1.676 m), weight 189 lb (85.7  kg), SpO2 99 %.   General: No apparent distress alert and oriented x3 mood and affect normal, dressed appropriately.  HEENT: Pupils equal, extraocular movements intact  Respiratory: Patient's speak in full sentences and does not appear short of breath  Cardiovascular: No lower extremity edema, non tender, no erythema  Gait normal with good balance and coordination.  MSK:  Neck exam shows significant loss of lordosis.  Does have tightness noted mostly on the right side of the neck.  Negative Spurling's.  Patient  does have tightness of the trapezius right greater than left as well.  Limited sidebending to the left and rotation to the right secondary to tightness noted.  5 out of 5 strength of the upper extremities otherwise.  Osteopathic findings C4 flexed rotated and side bent right C7 flexed rotated and side bent left T5 extended rotated and side bent right   Impression and Recommendations:     The above documentation has been reviewed and is accurate and complete Judi Saa, DO

## 2020-11-18 NOTE — Progress Notes (Signed)
[name] [address] [place] [address] Phone: [phone] Subjective:   I [name] am serving as a Neurosurgeon for Dr. Antoine Primas.  This visit occurred during the SARS-CoV-2 public health emergency.  Safety protocols were in place, including screening questions prior to the visit, additional usage of staff PPE, and extensive cleaning of exam room while observing appropriate contact time as indicated for disinfecting solutions.   I'm seeing this patient by the request  of:  Farris Has, MD  CC: Back and neck pain follow-up  TMH:[id number]  Sharon West is a 45 y.o. female coming in with complaint of back and neck pain. OMT 10/20/2020. Patient states she is doing better. No more acute pain just stiffness. Dry needling and new pillow has helped.  Patient has made some progress.  Feels that the prednisone was significantly helpful.  Not having any radiation of pain.  States that when she does heavy lifting she starts having increasing discomfort and pain again. Patient does not have any radiation of the pain.  Medications patient has been prescribed: Prednisone      Patient's neck x-rays were independently visualized by me showing moderate degenerative disc disease at the C5-C6 with some foraminal narrowing noted.  Patient also has some calcific changes of the soft tissue of the neck questionably even in the thyroid.    Reviewed prior external information including notes and imaging from previsou exam, outside providers and external EMR if available.   As well as notes that were available from care everywhere and other healthcare systems.  Past medical history, social, surgical and family history all reviewed in electronic medical record.  No pertanent information unless stated regarding to the chief complaint.   No past medical history on file.  Allergies  Allergen Reactions  . Penicillins      Review of Systems:  No headache,  visual changes, nausea, vomiting, diarrhea, constipation, dizziness, abdominal pain, skin rash, fevers, chills, night sweats, weight loss, swollen lymph nodes, body aches, joint swelling, chest pain, shortness of breath, mood changes. POSITIVE muscle aches  Objective  Blood pressure 110/72, pulse 77, height 5\' 6"  (1.676 m), weight 188 lb (85.3 kg), SpO2 99 %.   General: No apparent distress alert and oriented x3 mood and affect normal, dressed appropriately.  HEENT: Pupils equal, extraocular movements intact  Respiratory: Patient's speak in full sentences and does not appear short of breath  Cardiovascular: No lower extremity edema, non tender, no erythema  Gait normal with good balance and coordination.  MSK:   Neck exam does have some loss of lordosis.  Osteopathic findings  C2 flexed rotated and side bent right C6 flexed rotated and side bent right  T5 extended rotated and side bent right inhaled rib       Assessment and Plan:  Neck pain Patient does have some degenerative disc disease.  Patient does have calcific changes noted of the soft tissue of the neck as well but I would like further evaluation with ultrasound.  Patient's mother does have history of thyroid in addition of this patient has had low vitamin D previously and will get labs to further evaluate.  Patient did respond extremely well though to osteopathic manipulation today.  Hopefully this will be more beneficial as well.  Follow-up with me again in 8 weeks to see how patient is responding.  Nonallopathic lesion of cervical region   Decision today to treat with OMT was based on Physical Exam  After verbal  consent patient was treated with  ME, FPR techniques in cervical, thoracic, areas, all areas are chronic   Patient tolerated the procedure well with improvement in symptoms  Patient given exercises, stretches and lifestyle modifications  See medications in patient instructions if given  Patient will follow  up in 8 weeks        The above documentation has been reviewed and is accurate and complete Judi Saa, DO       Note: This dictation was prepared with Dragon dictation along with smaller phrase technology. Any transcriptional errors that result from this process are unintentional.

## 2020-11-19 ENCOUNTER — Other Ambulatory Visit: Payer: Self-pay

## 2020-11-19 ENCOUNTER — Encounter: Payer: Self-pay | Admitting: Family Medicine

## 2020-11-19 ENCOUNTER — Ambulatory Visit (INDEPENDENT_AMBULATORY_CARE_PROVIDER_SITE_OTHER): Payer: BC Managed Care – PPO | Admitting: Family Medicine

## 2020-11-19 VITALS — BP 110/72 | HR 77 | Ht 66.0 in | Wt 188.0 lb

## 2020-11-19 DIAGNOSIS — M542 Cervicalgia: Secondary | ICD-10-CM

## 2020-11-19 DIAGNOSIS — M2550 Pain in unspecified joint: Secondary | ICD-10-CM

## 2020-11-19 DIAGNOSIS — E038 Other specified hypothyroidism: Secondary | ICD-10-CM | POA: Diagnosis not present

## 2020-11-19 DIAGNOSIS — M999 Biomechanical lesion, unspecified: Secondary | ICD-10-CM | POA: Diagnosis not present

## 2020-11-19 LAB — COMPREHENSIVE METABOLIC PANEL
ALT: 14 U/L (ref 0–35)
AST: 16 U/L (ref 0–37)
Albumin: 4.4 g/dL (ref 3.5–5.2)
Alkaline Phosphatase: 31 U/L — ABNORMAL LOW (ref 39–117)
BUN: 16 mg/dL (ref 6–23)
CO2: 28 mEq/L (ref 19–32)
Calcium: 9.3 mg/dL (ref 8.4–10.5)
Chloride: 103 mEq/L (ref 96–112)
Creatinine, Ser: 0.83 mg/dL (ref 0.40–1.20)
GFR: 86.15 mL/min (ref >60.00)
Glucose, Bld: 104 mg/dL — ABNORMAL HIGH (ref 70–99)
Potassium: 4.7 mEq/L (ref 3.5–5.1)
Sodium: 136 mEq/L (ref 135–145)
Total Bilirubin: 0.6 mg/dL (ref 0.2–1.2)
Total Protein: 7.4 g/dL (ref 6.0–8.3)

## 2020-11-19 LAB — VITAMIN D 25 HYDROXY (VIT D DEFICIENCY, FRACTURES): VITD: 29.43 ng/mL — ABNORMAL LOW (ref 30.00–100.00)

## 2020-11-19 LAB — TSH: TSH: 1.11 u[IU]/mL (ref 0.35–4.50)

## 2020-11-19 LAB — T4, FREE: Free T4: 0.87 ng/dL (ref 0.60–1.60)

## 2020-11-19 LAB — T3, FREE: T3, Free: 2.6 pg/mL (ref 2.3–4.2)

## 2020-11-19 LAB — SEDIMENTATION RATE: Sed Rate: 6 mm/hr (ref 0–20)

## 2020-11-19 NOTE — Assessment & Plan Note (Signed)
   Decision today to treat with OMT was based on Physical Exam  After verbal consent patient was treated with  ME, FPR techniques in cervical, thoracic, areas, all areas are chronic   Patient tolerated the procedure well with improvement in symptoms  Patient given exercises, stretches and lifestyle modifications  See medications in patient instructions if given  Patient will follow up in 8 weeks

## 2020-11-19 NOTE — Patient Instructions (Addendum)
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

## 2020-11-19 NOTE — Assessment & Plan Note (Signed)
Patient does have some degenerative disc disease.  Patient does have calcific changes noted of the soft tissue of the neck as well but I would like further evaluation with ultrasound.  Patient's mother does have history of thyroid in addition of this patient has had low vitamin D previously and will get labs to further evaluate.  Patient did respond extremely well though to osteopathic manipulation today.  Hopefully this will be more beneficial as well.  Follow-up with me again in 8 weeks to see how patient is responding.

## 2020-11-20 ENCOUNTER — Encounter: Payer: Self-pay | Admitting: Family Medicine

## 2020-11-21 LAB — PTH, INTACT AND CALCIUM
Calcium: 9.2 mg/dL (ref 8.6–10.2)
PTH: 48 pg/mL (ref 16–77)

## 2020-11-21 LAB — CALCIUM, IONIZED: Calcium, Ion: 4.98 mg/dL (ref 4.8–5.6)

## 2020-12-03 ENCOUNTER — Other Ambulatory Visit: Payer: BC Managed Care – PPO

## 2020-12-17 ENCOUNTER — Ambulatory Visit
Admission: RE | Admit: 2020-12-17 | Discharge: 2020-12-17 | Disposition: A | Discharge status: Home / Self Care | Payer: BC Managed Care – PPO | Source: Ambulatory Visit | Attending: Family Medicine | Admitting: Family Medicine

## 2020-12-17 DIAGNOSIS — E038 Other specified hypothyroidism: Secondary | ICD-10-CM

## 2020-12-17 DIAGNOSIS — E041 Nontoxic single thyroid nodule: Secondary | ICD-10-CM | POA: Diagnosis not present

## 2021-01-14 ENCOUNTER — Ambulatory Visit: Payer: BC Managed Care – PPO | Admitting: Family Medicine

## 2021-01-21 ENCOUNTER — Encounter (HOSPITAL_BASED_OUTPATIENT_CLINIC_OR_DEPARTMENT_OTHER): Payer: Self-pay | Admitting: Obstetrics & Gynecology

## 2021-01-26 ENCOUNTER — Encounter (HOSPITAL_BASED_OUTPATIENT_CLINIC_OR_DEPARTMENT_OTHER): Payer: Self-pay | Admitting: Obstetrics & Gynecology

## 2021-01-26 NOTE — Progress Notes (Signed)
Unable to scan in negative PAP result as no collection date is visible on result note.

## 2021-02-24 ENCOUNTER — Encounter (HOSPITAL_BASED_OUTPATIENT_CLINIC_OR_DEPARTMENT_OTHER): Payer: Self-pay

## 2021-02-24 ENCOUNTER — Encounter (HOSPITAL_BASED_OUTPATIENT_CLINIC_OR_DEPARTMENT_OTHER): Payer: Self-pay | Admitting: Obstetrics & Gynecology

## 2021-03-11 DIAGNOSIS — Z13220 Encounter for screening for lipoid disorders: Secondary | ICD-10-CM | POA: Diagnosis not present

## 2021-03-11 DIAGNOSIS — Z0000 Encounter for general adult medical examination without abnormal findings: Secondary | ICD-10-CM | POA: Diagnosis not present

## 2021-03-11 DIAGNOSIS — E559 Vitamin D deficiency, unspecified: Secondary | ICD-10-CM | POA: Diagnosis not present

## 2021-03-11 DIAGNOSIS — D649 Anemia, unspecified: Secondary | ICD-10-CM | POA: Diagnosis not present

## 2021-03-11 DIAGNOSIS — R7309 Other abnormal glucose: Secondary | ICD-10-CM | POA: Diagnosis not present

## 2021-04-01 DIAGNOSIS — Z1231 Encounter for screening mammogram for malignant neoplasm of breast: Secondary | ICD-10-CM | POA: Diagnosis not present

## 2021-04-06 ENCOUNTER — Encounter (HOSPITAL_BASED_OUTPATIENT_CLINIC_OR_DEPARTMENT_OTHER): Payer: Self-pay | Admitting: Obstetrics & Gynecology

## 2021-05-27 ENCOUNTER — Other Ambulatory Visit (HOSPITAL_BASED_OUTPATIENT_CLINIC_OR_DEPARTMENT_OTHER): Payer: Self-pay

## 2021-05-27 DIAGNOSIS — L821 Other seborrheic keratosis: Secondary | ICD-10-CM | POA: Diagnosis not present

## 2021-05-27 DIAGNOSIS — L578 Other skin changes due to chronic exposure to nonionizing radiation: Secondary | ICD-10-CM | POA: Diagnosis not present

## 2021-05-27 DIAGNOSIS — D485 Neoplasm of uncertain behavior of skin: Secondary | ICD-10-CM | POA: Diagnosis not present

## 2021-05-27 DIAGNOSIS — D2339 Other benign neoplasm of skin of other parts of face: Secondary | ICD-10-CM | POA: Diagnosis not present

## 2021-05-27 DIAGNOSIS — D225 Melanocytic nevi of trunk: Secondary | ICD-10-CM | POA: Diagnosis not present

## 2021-05-27 DIAGNOSIS — L820 Inflamed seborrheic keratosis: Secondary | ICD-10-CM | POA: Diagnosis not present

## 2021-05-27 DIAGNOSIS — L720 Epidermal cyst: Secondary | ICD-10-CM | POA: Diagnosis not present

## 2021-05-27 DIAGNOSIS — L570 Actinic keratosis: Secondary | ICD-10-CM | POA: Diagnosis not present

## 2021-05-27 MED ORDER — INFLUENZA VAC SPLIT QUAD 0.5 ML IM SUSY
PREFILLED_SYRINGE | INTRAMUSCULAR | 0 refills | Status: DC
  Filled 2021-05-27: qty 0.5, 1d supply, fill #0

## 2021-06-03 ENCOUNTER — Ambulatory Visit (INDEPENDENT_AMBULATORY_CARE_PROVIDER_SITE_OTHER): Payer: BC Managed Care – PPO | Admitting: Obstetrics & Gynecology

## 2021-06-03 ENCOUNTER — Encounter (HOSPITAL_BASED_OUTPATIENT_CLINIC_OR_DEPARTMENT_OTHER): Payer: Self-pay | Admitting: Obstetrics & Gynecology

## 2021-06-03 ENCOUNTER — Other Ambulatory Visit: Payer: Self-pay

## 2021-06-03 VITALS — BP 119/79 | HR 67 | Ht 65.5 in | Wt 194.0 lb

## 2021-06-03 DIAGNOSIS — Z01419 Encounter for gynecological examination (general) (routine) without abnormal findings: Secondary | ICD-10-CM | POA: Diagnosis not present

## 2021-06-03 DIAGNOSIS — Z975 Presence of (intrauterine) contraceptive device: Secondary | ICD-10-CM

## 2021-06-03 DIAGNOSIS — Z8742 Personal history of other diseases of the female genital tract: Secondary | ICD-10-CM

## 2021-06-03 DIAGNOSIS — Z800 Family history of malignant neoplasm of digestive organs: Secondary | ICD-10-CM | POA: Diagnosis not present

## 2021-06-03 NOTE — Progress Notes (Signed)
45 y.o. G70P3003 Married White or Caucasian female here for annual exam.  Had IUD placed 09/2014.  Cycles are very light and short.     No LMP recorded. (Menstrual status: IUD).          Sexually active: Yes.    The current method of family planning is IUD.    Exercising: Yes.     Smoker:  no  Health Maintenance: Pap:  07/16/2019 neg/HR HPV History of abnormal Pap:  no MMG:  04/01/21 neg Colonoscopy:  guidelines reviewed Screening Labs: done in the spring with Dr. Kateri Plummer   reports that she has never smoked. She has never used smokeless tobacco. She reports current alcohol use of about 5.0 standard drinks per week. She reports that she does not use drugs.  Past Medical History:  Diagnosis Date   Vitamin D deficiency     Past Surgical History:  Procedure Laterality Date   CESAREAN SECTION     REPAIR ANKLE LIGAMENT Left     Current Outpatient Medications  Medication Sig Dispense Refill   levonorgestrel (MIRENA) 20 MCG/DAY IUD by Intrauterine route.     No current facility-administered medications for this visit.    Family History  Problem Relation Age of Onset   Colon cancer Maternal Grandmother    Cancer Mother        skin   Hypertension Mother     Review of Systems  All other systems reviewed and are negative.  Exam:   BP 119/79   Pulse 67   Ht 5' 5.5" (1.664 m)   Wt 194 lb (88 kg)   BMI 31.79 kg/m   Height: 5' 5.5" (166.4 cm)  General appearance: alert, cooperative and appears stated age Head: Normocephalic, without obvious abnormality, atraumatic Neck: no adenopathy, supple, symmetrical, trachea midline and thyroid normal to inspection and palpation Lungs: clear to auscultation bilaterally Breasts: normal appearance, no masses or tenderness Heart: regular rate and rhythm Abdomen: soft, non-tender; bowel sounds normal; no masses,  no organomegaly Extremities: extremities normal, atraumatic, no cyanosis or edema Skin: Skin color, texture, turgor normal. No  rashes or lesions Lymph nodes: Cervical, supraclavicular, and axillary nodes normal. No abnormal inguinal nodes palpated Neurologic: Grossly normal   Pelvic: External genitalia:  no lesions              Urethra:  normal appearing urethra with no masses, tenderness or lesions              Bartholins and Skenes: normal                 Vagina: normal appearing vagina with normal color and no discharge, no lesions              Cervix: no lesions, IUD string noted              Pap taken: No. Bimanual Exam:  Uterus:  normal size, contour, position, consistency, mobility, non-tender              Adnexa: normal adnexa and no mass, fullness, tenderness               Rectovaginal: Confirms               Anus:  normal sphincter tone, no lesions  Chaperone, Raechel Ache, CMA, was present for exam.  Assessment/Plan: 1. Well woman exam with routine gynecological exam - neg pap with neg HR HPV 2020 scanned into Epic.  Not indicated today. - MMG normal 04/2021 -  BMD screening discussed - pt will send me dates of vaccines to updated Care Gaps.  2. IUD (intrauterine device) in place - Mirena placed 09/2014  3. Family history of colon cancer -pt will find out about when her mother was first diagnosed with polyp to see if should start screening earlier than age 32.  4. History of menorrhagia - well controlled with Mirena IUD

## 2021-06-05 ENCOUNTER — Encounter (HOSPITAL_BASED_OUTPATIENT_CLINIC_OR_DEPARTMENT_OTHER): Payer: Self-pay

## 2021-06-08 ENCOUNTER — Encounter (HOSPITAL_BASED_OUTPATIENT_CLINIC_OR_DEPARTMENT_OTHER): Payer: Self-pay

## 2022-03-03 NOTE — Progress Notes (Signed)
I, Christoper Fabian, LAT, ATC, am serving as scribe for Dr. Clementeen Graham.  Sharon West is a 46 y.o. female who presents to [facility] today for B foot pain.  She was last seen by Dr. Katrinka Blazing on 11/19/20 for OMT.  Today, pt reports B foot pain x a couple of months w/ no MOI.  She locates her pain to plantar aspect of bilat feet. Pt also c/o pain along her MTP's in her R foot. Pt has a hx of calcaneal stress fx a few years ago. Pt is a PT for [employer] [place]. Pt reports she is only able to work 1/2 days and has been unable to exercise. Pt has a hiking trip in [place] in Aug that she is worried about being able to partake/enjoy.  Foot swelling: yes Aggravating factors: being on her feet, worse in the mornings Treatments tried: stretch, ice, roller ball, IBU, Voltaren gel   Pertinent review of systems: No fevers or chills  Relevant historical information: History of a right calcaneal fracture.   Exam:  BP 130/84   Pulse 66   Ht 5' 5.5" (1.664 m)   Wt 207 lb (93.9 kg)   SpO2 99%   BMI 33.92 kg/m  General: Well Developed, well nourished, and in no acute distress.   MSK: Right foot: Normal-appearing Normal foot motion.  Tender palpation right second and third metatarsal heads. Tender palpation plantar calcaneus.  Left foot normal.  Normal foot and ankle motion.  Tender palpation plantar calcaneus.    Lab and Radiology Results  Procedure: Real-time Ultrasound Guided Injection of right plantar fascia origin Device: Philips Affiniti 50G Images permanently stored and available for review in PACS Verbal informed consent obtained.  Discussed risks and benefits of procedure. Warned about infection, bleeding, hyperglycemia damage to structures among others. Patient expresses understanding and agreement Time-out conducted.   Noted no overlying erythema, induration, or other signs of local infection.   Skin prepped in a sterile fashion.    Local anesthesia: Topical Ethyl chloride.   With sterile technique and under real time ultrasound guidance: 40 mg of Kenalog and 1 mL of Marcaine injected into plantar fascia origin calcaneus. Fluid seen entering the plantar fascia.   Completed without difficulty   Pain immediately resolved suggesting accurate placement of the medication.   Advised to call if fevers/chills, erythema, induration, drainage, or persistent bleeding.   Images permanently stored and available for review in the ultrasound unit.  Impression: Technically successful ultrasound guided injection.    Procedure: Real-time Ultrasound Guided Injection of left foot plantar fascia origin Device: Philips Affiniti 50G Images permanently stored and available for review in PACS Verbal informed consent obtained.  Discussed risks and benefits of procedure. Warned about infection, bleeding, hyperglycemia damage to structures among others. Patient expresses understanding and agreement Time-out conducted.   Noted no overlying erythema, induration, or other signs of local infection.   Skin prepped in a sterile fashion.   Local anesthesia: Topical Ethyl chloride.   With sterile technique and under real time ultrasound guidance: 40 mg of Kenalog and 1 mL of Marcaine injected into plantar fascia origin. Fluid seen entering the plantar fascia.   Completed without difficulty   Pain immediately resolved suggesting accurate placement of the medication.   Advised to call if fevers/chills, erythema, induration, drainage, or persistent bleeding.   Images permanently stored and available for review in the ultrasound unit.  Impression: Technically successful ultrasound guided injection.  Assessment and Plan: 46 y.o. female with bilateral foot pain thought to be due to chronic bilateral planter fasciitis.  Additionally she has evidence of right metatarsalgia.  Continue home exercise program.  Recommend metatarsal pads and good heel  cushioning.  Also recommend night splints.  She has an upcoming trip to [facility] in 3 weeks.  We will proceed to plantar fascia injections today.  Once the pain starts returning recommend shockwave therapy.   PDMP not reviewed this encounter. Orders Placed This Encounter  Procedures   [place] LIMITED JOINT SPACE STRUCTURES LOW BILAT(NO LINKED CHARGES)    Order Specific Question:   Reason for Exam (SYMPTOM  OR DIAGNOSIS REQUIRED)    Answer:   B foot pain    Order Specific Question:   Preferred imaging location?    Answer:   [facility]   No orders of the defined types were placed in this encounter.    Discussed warning signs or symptoms. Please see discharge instructions. Patient expresses understanding.   The above documentation has been reviewed and is accurate and complete Clementeen Graham, M.D.

## 2022-03-04 ENCOUNTER — Ambulatory Visit (INDEPENDENT_AMBULATORY_CARE_PROVIDER_SITE_OTHER): Payer: BC Managed Care – PPO | Admitting: Family Medicine

## 2022-03-04 ENCOUNTER — Ambulatory Visit: Payer: Self-pay

## 2022-03-04 VITALS — BP 130/84 | HR 66 | Ht 65.5 in | Wt 207.0 lb

## 2022-03-04 DIAGNOSIS — M722 Plantar fascial fibromatosis: Secondary | ICD-10-CM

## 2022-03-04 NOTE — Patient Instructions (Addendum)
Thank you for coming in today.   You had bilateral plantar fascia injections.  Call or go to the ER if you develop a large red swollen joint with extreme pain or oozing puss.   Use padding in your shoe inserts like we discussed.  Ice cup massage  Follow-up: as needed  Enjoy your trip!

## 2022-03-09 IMAGING — US US THYROID
1 series · 13 of 25 positions shown · non-contrast
Comparison: None.
COMPARISON: None.

Addendum:
CLINICAL DATA: Neck pain

EXAM:
THYROID ULTRASOUND
TECHNIQUE: Ultrasound examination of the thyroid gland and adjacent soft
tissues was performed.

[Series 1: us thyroid · 0.04mm/px · 13 of 51 slices shown]
[im 1/51]
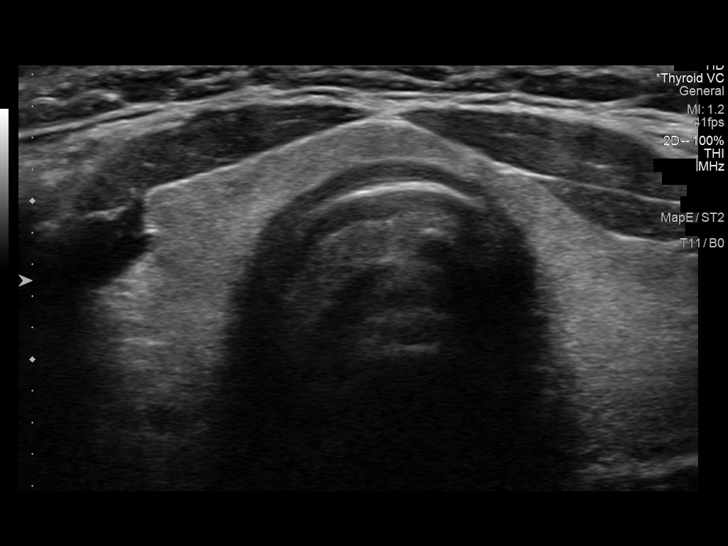
[im 5/51]
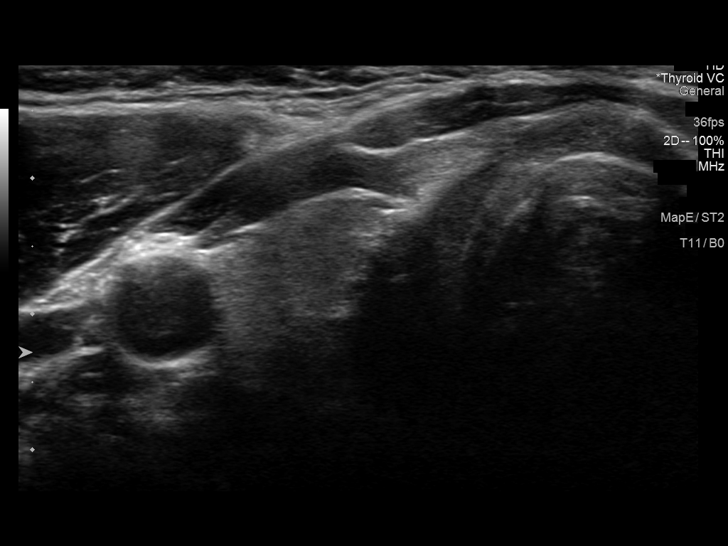
[im 9/51]
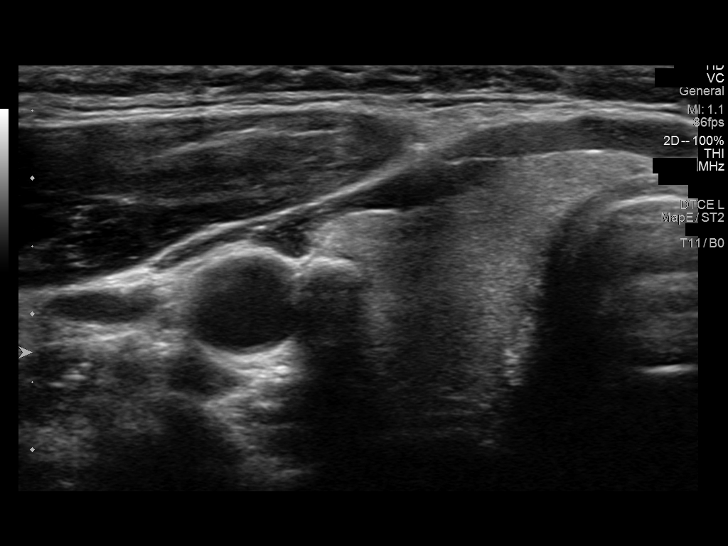
[im 13/51]
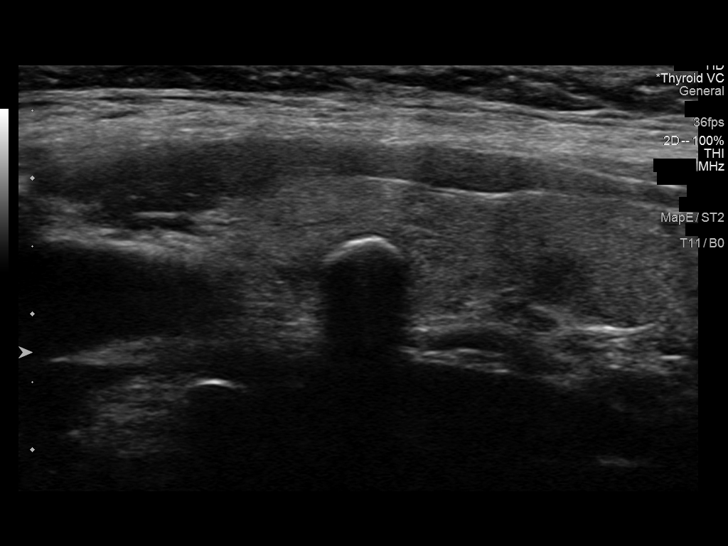
[im 17/51]
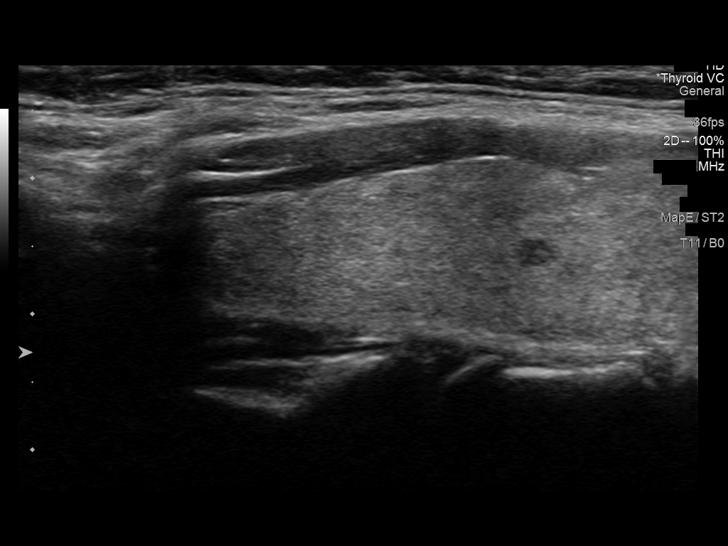
[im 21/51]
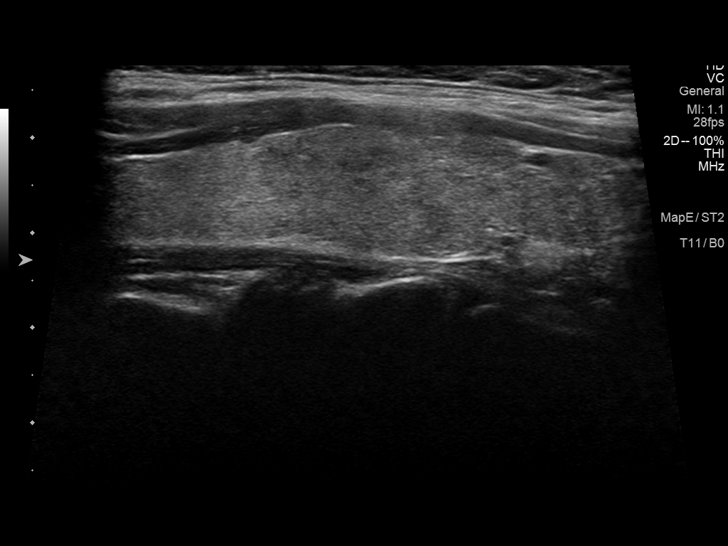
[im 26/51]
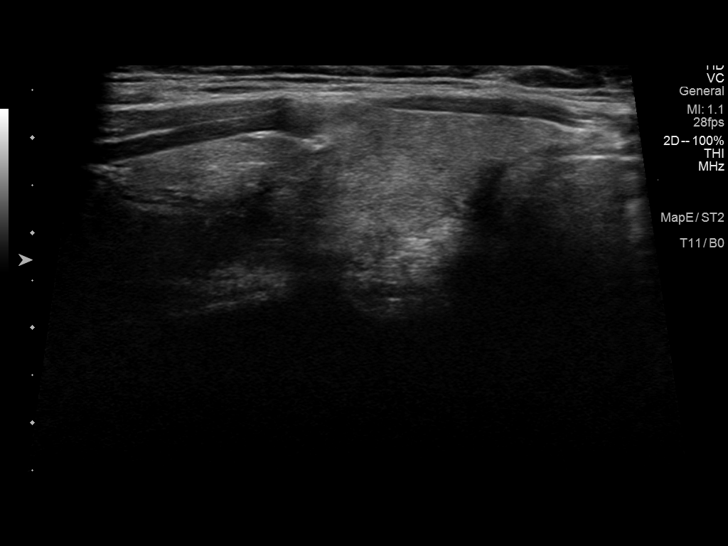
[im 30/51]
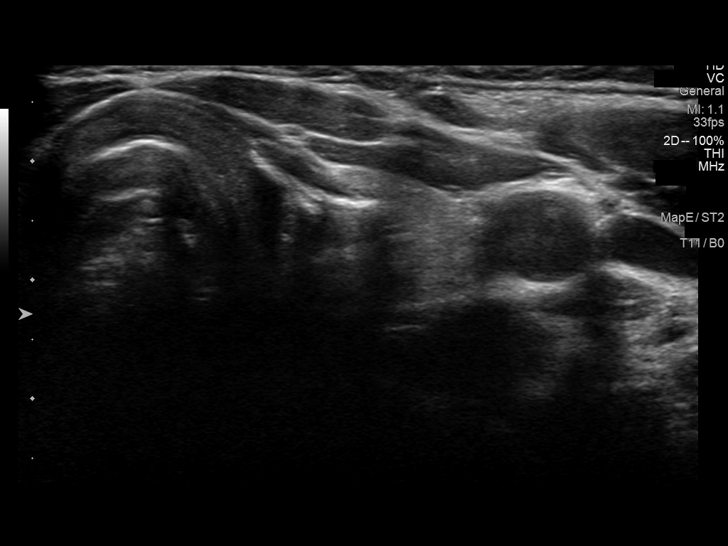
[im 34/51]
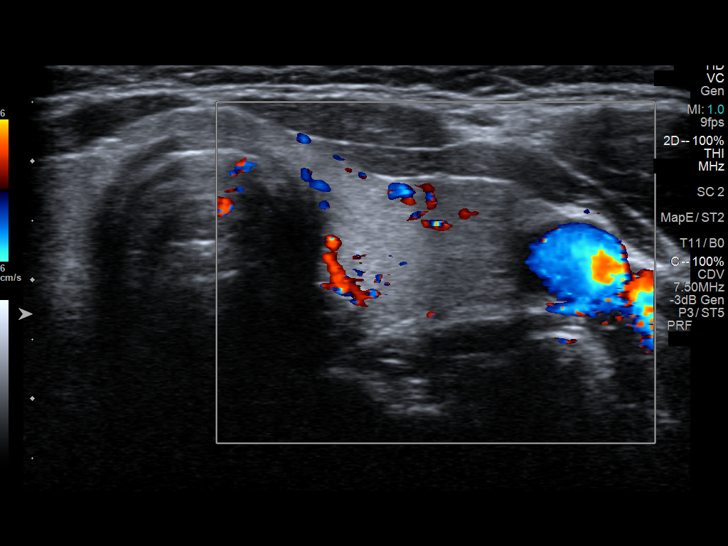
[im 38/51]
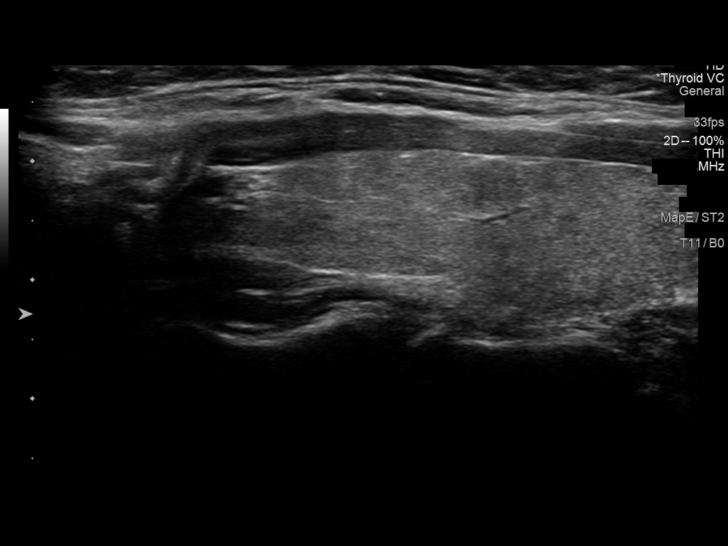
[im 42/51]
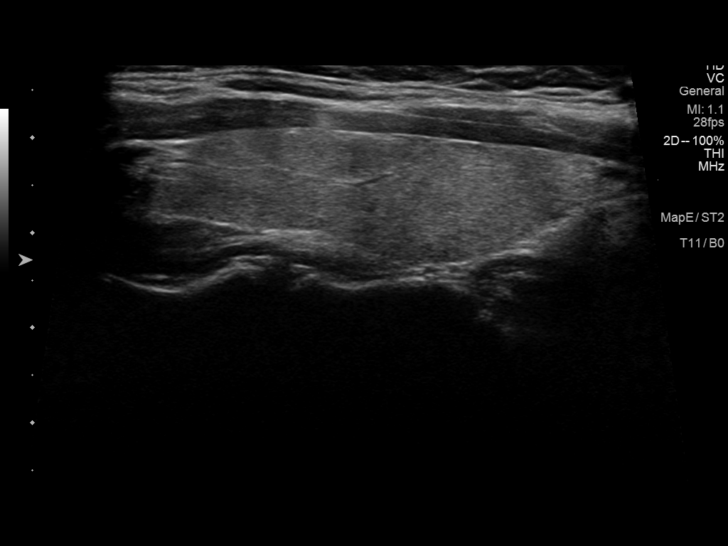
[im 46/51]
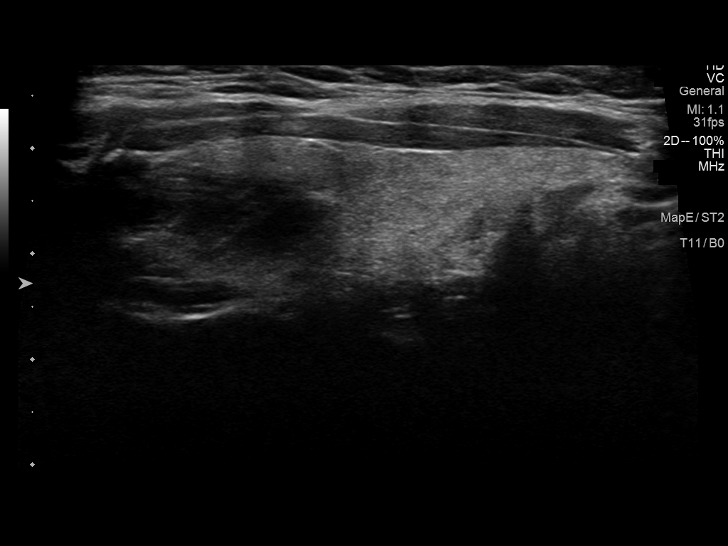
[im 51/51]
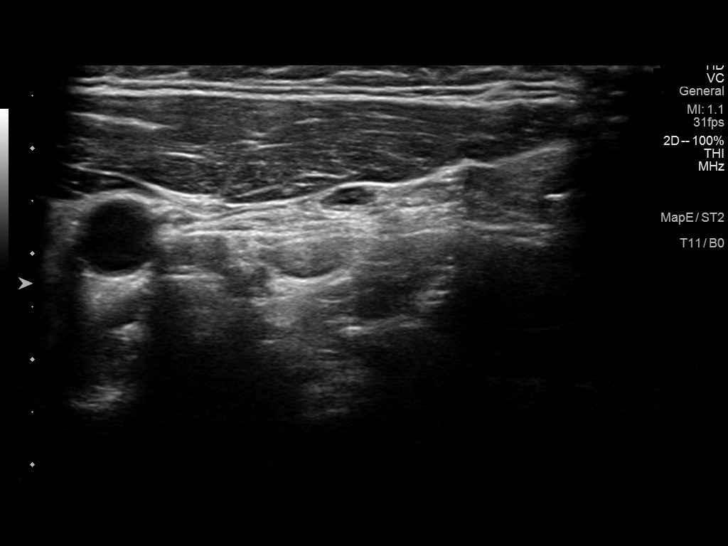

[13 of 25 positions shown; findings below may reference images not displayed]

FINDINGS: Parenchymal Echotexture: Mildly heterogeneous

Isthmus: 4.3 cm

Right lobe: 5.9 x 1.4 x 1.8 cm

Left lobe: 5.3 x 1.4 x 1.6 cm

_________________________________________________________

Estimated total number of nodules >/= 1 cm: 0

Number of spongiform nodules >/=  2 cm not described below (TR1): 0

Number of mixed cystic and solid nodules >/= 1.5 cm not described
below (TR2): 0

_________________________________________________________

Subcentimeter rim calcified right thyroid nodule does not meet
criteria for FNA or imaging follow-up.
IMPRESSION: Suspicious thyroid nodules.

The above is in keeping with the ACR TI-RADS recommendations - [HOSPITAL] 9567;[DATE].

ADDENDUM:
Correction to Impression:

NO suspicious thyroid nodules.

*** End of Addendum ***
FINDINGS: Parenchymal Echotexture: Mildly heterogeneous

Isthmus: 4.3 cm

Right lobe: 5.9 x 1.4 x 1.8 cm

Left lobe: 5.3 x 1.4 x 1.6 cm

_________________________________________________________

Estimated total number of nodules >/= 1 cm: 0

Number of spongiform nodules >/=  2 cm not described below (TR1): 0

Number of mixed cystic and solid nodules >/= 1.5 cm not described
below (TR2): 0

_________________________________________________________

Subcentimeter rim calcified right thyroid nodule does not meet
criteria for FNA or imaging follow-up.
IMPRESSION: Suspicious thyroid nodules.

The above is in keeping with the ACR TI-RADS recommendations - [HOSPITAL] 9567;[DATE].

## 2022-03-24 DIAGNOSIS — R7309 Other abnormal glucose: Secondary | ICD-10-CM | POA: Diagnosis not present

## 2022-03-24 DIAGNOSIS — D649 Anemia, unspecified: Secondary | ICD-10-CM | POA: Diagnosis not present

## 2022-03-24 DIAGNOSIS — Z13220 Encounter for screening for lipoid disorders: Secondary | ICD-10-CM | POA: Diagnosis not present

## 2022-03-24 DIAGNOSIS — Z0000 Encounter for general adult medical examination without abnormal findings: Secondary | ICD-10-CM | POA: Diagnosis not present

## 2022-03-24 DIAGNOSIS — E559 Vitamin D deficiency, unspecified: Secondary | ICD-10-CM | POA: Diagnosis not present

## 2022-04-04 DIAGNOSIS — Z1231 Encounter for screening mammogram for malignant neoplasm of breast: Secondary | ICD-10-CM | POA: Diagnosis not present

## 2022-04-14 ENCOUNTER — Encounter (HOSPITAL_BASED_OUTPATIENT_CLINIC_OR_DEPARTMENT_OTHER): Payer: Self-pay | Admitting: *Deleted

## 2022-05-27 ENCOUNTER — Other Ambulatory Visit (HOSPITAL_BASED_OUTPATIENT_CLINIC_OR_DEPARTMENT_OTHER): Payer: Self-pay

## 2022-05-27 DIAGNOSIS — L821 Other seborrheic keratosis: Secondary | ICD-10-CM | POA: Diagnosis not present

## 2022-05-27 DIAGNOSIS — D225 Melanocytic nevi of trunk: Secondary | ICD-10-CM | POA: Diagnosis not present

## 2022-05-27 DIAGNOSIS — L720 Epidermal cyst: Secondary | ICD-10-CM | POA: Diagnosis not present

## 2022-05-27 DIAGNOSIS — L578 Other skin changes due to chronic exposure to nonionizing radiation: Secondary | ICD-10-CM | POA: Diagnosis not present

## 2022-05-27 DIAGNOSIS — L570 Actinic keratosis: Secondary | ICD-10-CM | POA: Diagnosis not present

## 2022-05-27 MED ORDER — FLUARIX QUADRIVALENT 0.5 ML IM SUSY
PREFILLED_SYRINGE | INTRAMUSCULAR | 0 refills | Status: DC
  Filled 2022-05-27: qty 0.5, 1d supply, fill #0

## 2022-06-10 ENCOUNTER — Encounter (HOSPITAL_BASED_OUTPATIENT_CLINIC_OR_DEPARTMENT_OTHER): Payer: Self-pay | Admitting: Obstetrics & Gynecology

## 2022-06-10 ENCOUNTER — Other Ambulatory Visit (HOSPITAL_COMMUNITY)
Admission: RE | Admit: 2022-06-10 | Discharge: 2022-06-10 | Disposition: A | Discharge status: Home / Self Care | Payer: BC Managed Care – PPO | Source: Ambulatory Visit | Attending: Obstetrics & Gynecology | Admitting: Obstetrics & Gynecology

## 2022-06-10 ENCOUNTER — Ambulatory Visit (INDEPENDENT_AMBULATORY_CARE_PROVIDER_SITE_OTHER): Payer: BC Managed Care – PPO | Admitting: Obstetrics & Gynecology

## 2022-06-10 VITALS — BP 121/69 | HR 68 | Ht 66.0 in | Wt 206.6 lb

## 2022-06-10 DIAGNOSIS — Z124 Encounter for screening for malignant neoplasm of cervix: Secondary | ICD-10-CM | POA: Insufficient documentation

## 2022-06-10 DIAGNOSIS — N920 Excessive and frequent menstruation with regular cycle: Secondary | ICD-10-CM

## 2022-06-10 DIAGNOSIS — Z1211 Encounter for screening for malignant neoplasm of colon: Secondary | ICD-10-CM | POA: Diagnosis not present

## 2022-06-10 DIAGNOSIS — Z01419 Encounter for gynecological examination (general) (routine) without abnormal findings: Secondary | ICD-10-CM

## 2022-06-10 DIAGNOSIS — Z30433 Encounter for removal and reinsertion of intrauterine contraceptive device: Secondary | ICD-10-CM | POA: Diagnosis not present

## 2022-06-10 DIAGNOSIS — Z975 Presence of (intrauterine) contraceptive device: Secondary | ICD-10-CM

## 2022-06-10 MED ORDER — LEVONORGESTREL 20 MCG/DAY IU IUD
1.0000 | INTRAUTERINE_SYSTEM | Freq: Once | INTRAUTERINE | Status: AC
  Administered 2022-06-10: 1 via INTRAUTERINE

## 2022-06-10 NOTE — Progress Notes (Signed)
46 y.o. G85P3003 Married White or Caucasian female here for annual exam.  Has IUD and is having more regular cycles.  Due for removal 09/2022 but bleeding is definitely more than it has been.  Would like to proceed with removal/placement today  No LMP recorded. (Menstrual status: IUD).          Sexually active: Yes.    The current method of family planning is vasectomy.    Smoker:  no  Health Maintenance: Pap:  06/2019, neg HR HPV History of abnormal Pap:  no MMG:  04/04/2022 Negative Colonoscopy:  referral  Screening Labs: with Dr. Orland Mustard   reports that she has never smoked. She has never used smokeless tobacco. She reports current alcohol use of about 5.0 standard drinks of alcohol per week. She reports that she does not use drugs.  Past Medical History:  Diagnosis Date   Vitamin D deficiency     Past Surgical History:  Procedure Laterality Date   CESAREAN SECTION     x 3   REPAIR ANKLE LIGAMENT Left     Current Outpatient Medications  Medication Sig Dispense Refill   influenza vac split quadrivalent PF (FLUARIX QUADRIVALENT) 0.5 ML injection Inject into the muscle. 0.5 mL 0   levonorgestrel (MIRENA) 20 MCG/DAY IUD by Intrauterine route.     Vitamin D, Ergocalciferol, (DRISDOL) 1.25 MG (50000 UNIT) CAPS capsule Take 50,000 Units by mouth every 7 (seven) days. (Patient not taking: Reported on 06/10/2022)     No current facility-administered medications for this visit.    Family History  Problem Relation Age of Onset   Cancer Mother        skin   Hypertension Mother    Colon cancer Maternal Grandmother 34    ROS: Constitutional: negative Genitourinary:negative  Exam:   BP 121/69 (BP Location: Right Arm, Patient Position: Sitting, Cuff Size: Large)   Pulse 68   Ht 5\' 6"  (1.676 m) Comment: Reported  Wt 206 lb 9.6 oz (93.7 kg)   BMI 33.35 kg/m   Height: 5\' 6"  (167.6 cm) (Reported)  General appearance: alert, cooperative and appears stated age Head: Normocephalic,  without obvious abnormality, atraumatic Neck: no adenopathy, supple, symmetrical, trachea midline and thyroid normal to inspection and palpation Lungs: clear to auscultation bilaterally Breasts: normal appearance, no masses or tenderness Heart: regular rate and rhythm Abdomen: soft, non-tender; bowel sounds normal; no masses,  no organomegaly Extremities: extremities normal, atraumatic, no cyanosis or edema Skin: Skin color, texture, turgor normal. No rashes or lesions Lymph nodes: Cervical, supraclavicular, and axillary nodes normal. No abnormal inguinal nodes palpated Neurologic: Grossly normal   Pelvic: External genitalia:  no lesions              Urethra:  normal appearing urethra with no masses, tenderness or lesions              Bartholins and Skenes: normal                 Vagina: normal appearing vagina with normal color and no discharge, no lesions              Cervix: no lesions              Pap taken: Yes.   Bimanual Exam:  Uterus:  normal size, contour, position, consistency, mobility, non-tender              Adnexa: normal adnexa and no mass, fullness, tenderness  Rectovaginal: Confirms               Anus:  normal sphincter tone, no lesions  Procedure:  Consent obtained.  Cervix cleansed with Betadine x 3.  IUD string noted and IUD removed easily with one pull of ringed forceps.  Single toothed tenaculum placed on anterior lip of cervix.  Uterus sounded to 8cm.  Mirena IUD with introducer passed to fundus, slightly withdrawn and then IUD released.  Introducer removed.  Strings cut to 2cm.  Tenaculum and speculum removed.  Pt tolerated procedure well.  Chaperone, Ezekiel Ina, RN, was present for exam.  Assessment/Plan: 1. Well woman exam with routine gynecological exam - Pap smear with high risk HPV today - Mammogram 03/2022 - Colonoscopy referral placed today - lab work done done with PCP - vaccines reviewed/updated  2. Cervical cancer screening - Cytology  - PAP( [facility])  3. Colon cancer screening - Ambulatory referral to Gastroenterology  4. Menorrhagia with regular cycle - IUD removed and replaced  5.  IUD removal and replacement - post procedure instructions and follow up discussed

## 2022-06-13 LAB — CYTOLOGY - PAP
Comment: NEGATIVE
Diagnosis: NEGATIVE
High risk HPV: NEGATIVE

## 2022-07-01 ENCOUNTER — Ambulatory Visit (AMBULATORY_SURGERY_CENTER): Payer: Self-pay

## 2022-07-01 ENCOUNTER — Other Ambulatory Visit: Payer: Self-pay

## 2022-07-01 VITALS — Ht 66.0 in | Wt 205.0 lb

## 2022-07-01 DIAGNOSIS — Z1211 Encounter for screening for malignant neoplasm of colon: Secondary | ICD-10-CM

## 2022-07-01 MED ORDER — NA SULFATE-K SULFATE-MG SULF 17.5-3.13-1.6 GM/177ML PO SOLN: 1.0000 | Freq: Once | ORAL | 0 refills | Status: AC

## 2022-07-01 NOTE — Progress Notes (Signed)
Denies allergies to eggs or soy products. Denies complication of anesthesia or sedation. Denies use of weight loss medication. Denies use of O2.   Emmi instructions given for colonoscopy.  

## 2022-07-04 ENCOUNTER — Encounter: Payer: Self-pay | Admitting: Gastroenterology

## 2022-07-08 ENCOUNTER — Encounter: Payer: Self-pay | Admitting: Gastroenterology

## 2022-07-08 ENCOUNTER — Ambulatory Visit (AMBULATORY_SURGERY_CENTER): Payer: BC Managed Care – PPO | Admitting: Gastroenterology

## 2022-07-08 VITALS — BP 117/71 | HR 63 | Temp 98.4°F | Resp 15 | Ht 66.0 in | Wt 206.0 lb

## 2022-07-08 DIAGNOSIS — Z1211 Encounter for screening for malignant neoplasm of colon: Secondary | ICD-10-CM | POA: Diagnosis not present

## 2022-07-08 DIAGNOSIS — Z83719 Family history of colon polyps, unspecified: Secondary | ICD-10-CM | POA: Diagnosis not present

## 2022-07-08 DIAGNOSIS — Z800 Family history of malignant neoplasm of digestive organs: Secondary | ICD-10-CM | POA: Diagnosis not present

## 2022-07-08 MED ORDER — SODIUM CHLORIDE 0.9 % IV SOLN: 500.0000 mL | INTRAVENOUS | Status: DC

## 2022-07-08 NOTE — Patient Instructions (Signed)
Thank you for letting us take care of your healthcare needs today.    YOU HAD AN ENDOSCOPIC PROCEDURE TODAY AT THE [facility]:   Refer to the procedure report that was given to you for any specific questions about what was found during the examination.  If the procedure report does not answer your questions, please call your gastroenterologist to clarify.  If you requested that your care partner not be given the details of your procedure findings, then the procedure report has been included in a sealed envelope for you to review at your convenience later.  YOU SHOULD EXPECT: Some feelings of bloating in the abdomen. Passage of more gas than usual.  Walking can help get rid of the air that was put into your GI tract during the procedure and reduce the bloating. If you had a lower endoscopy (such as a colonoscopy or flexible sigmoidoscopy) you may notice spotting of blood in your stool or on the toilet paper. If you underwent a bowel prep for your procedure, you may not have a normal bowel movement for a few days.  Please Note:  You might notice some irritation and congestion in your nose or some drainage.  This is from the oxygen used during your procedure.  There is no need for concern and it should clear up in a day or so.  SYMPTOMS TO REPORT IMMEDIATELY:  Following lower endoscopy (colonoscopy or flexible sigmoidoscopy):  Excessive amounts of blood in the stool  Significant tenderness or worsening of abdominal pains  Swelling of the abdomen that is new, acute  Fever of 100F or higher   For urgent or emergent issues, a gastroenterologist can be reached at any hour by calling [phone]. Do not use MyChart messaging for urgent concerns.    DIET:  We do recommend a small meal at first, but then you may proceed to your regular diet.  Drink plenty of fluids but you should avoid alcoholic beverages for 24 hours.  ACTIVITY:  You should plan to take it easy for the rest of today  and you should NOT DRIVE or use heavy machinery until tomorrow (because of the sedation medicines used during the test).    FOLLOW UP: Our staff will call the number listed on your records the next business day following your procedure.  We will call around 7:15- 8:00 am to check on you and address any questions or concerns that you may have regarding the information given to you following your procedure. If we do not reach you, we will leave a message.     If any biopsies were taken you will be contacted by phone or by letter within the next 1-3 weeks.  Please call us at [phone] if you have not heard about the biopsies in 3 weeks.    SIGNATURES/CONFIDENTIALITY: You and/or your care partner have signed paperwork which will be entered into your electronic medical record.  These signatures attest to the fact that that the information above on your After Visit Summary has been reviewed and is understood.  Full responsibility of the confidentiality of this discharge information lies with you and/or your care-partner. 

## 2022-07-08 NOTE — Progress Notes (Signed)
Report to PACU, RN, vss, BBS= Clear.  

## 2022-07-08 NOTE — Progress Notes (Signed)
   Referring Provider: Farris Has, MD Primary Care Physician:  Farris Has, MD  Indication for Colonoscopy:  Colon cancer screening   IMPRESSION:  Need for colon cancer screening Appropriate candidate for monitored anesthesia care  PLAN: Colonoscopy in the [facility] today   HPI: Sharon West is a 46 y.o. female presents for screening colonoscopy.  No prior colonoscopy or colon cancer screening.  Maternal grandmother with colon cancer at age 47. Mother with colon polyps. No other known family history of colon cancer or polyps. No family history of uterine/endometrial cancer, pancreatic cancer or gastric/stomach cancer.   Past Medical History:  Diagnosis Date   Allergy    Arthritis    Rectocele    Vitamin D deficiency     Past Surgical History:  Procedure Laterality Date   CESAREAN SECTION     x 3   REPAIR ANKLE LIGAMENT Left     Current Outpatient Medications  Medication Sig Dispense Refill   levonorgestrel (MIRENA) 20 MCG/DAY IUD by Intrauterine route.     NON FORMULARY Vitamin D 3, One tablet daily.     NON FORMULARY Ibuprofen as needed for pain in her feet.     Current Facility-Administered Medications  Medication Dose Route Frequency Provider Last Rate Last Admin   0.9 %  sodium chloride infusion  500 mL Intravenous Continuous Tressia Danas, MD        Allergies as of 07/08/2022 - Review Complete 07/08/2022  Allergen Reaction Noted   Penicillins  06/19/2020    Family History  Problem Relation Age of Onset   Colon polyps Mother    Cancer Mother        skin   Hypertension Mother    Colon cancer Maternal Grandmother 33   Esophageal cancer Neg Hx    Rectal cancer Neg Hx    Stomach cancer Neg Hx      Physical Exam: General:   Alert,  well-nourished, pleasant and cooperative in NAD Head:  Normocephalic and atraumatic. Eyes:  Sclera clear, no icterus.   Conjunctiva pink. Mouth:  No deformity or lesions.   Neck:  Supple; no masses or  thyromegaly. Lungs:  Clear throughout to auscultation.   No wheezes. Heart:  Regular rate and rhythm; no murmurs. Abdomen:  Soft, non-tender, nondistended, normal bowel sounds, no rebound or guarding.  Msk:  Symmetrical. No boney deformities LAD: No inguinal or umbilical LAD Extremities:  No clubbing or edema. Neurologic:  Alert and  oriented x4;  grossly nonfocal Skin:  No obvious rash or bruise. Psych:  Alert and cooperative. Normal mood and affect.     Studies/Results: No results found.    Lanecia Sliva L. Orvan Falconer, MD, MPH 07/08/2022, 2:20 PM

## 2022-07-08 NOTE — Op Note (Signed)
[facility] Patient Name: Sharon West Procedure Date: 07/08/2022 2:21 PM MRN: [id number] Endoscopist: Tressia Danas MD, MD, [id number] Age: 46 Referring MD:  Date of Birth: 11-04-1975 Gender: Female Account #: [id number] Procedure:                Colonoscopy Indications:              Screening for colorectal malignant neoplasm, This                            is the patient's first colonoscopy                           Maternal grandmother with colon cancer at age 52                           Mother with colon polyps Medicines:                Monitored Anesthesia Care Procedure:                Pre-Anesthesia Assessment:                           - Prior to the procedure, a History and Physical                            was performed, and patient medications and                            allergies were reviewed. The patient's tolerance of                            previous anesthesia was also reviewed. The risks                            and benefits of the procedure and the sedation                            options and risks were discussed with the patient.                            All questions were answered, and informed consent                            was obtained. Prior Anticoagulants: The patient has                            taken no anticoagulant or antiplatelet agents. ASA                            Grade Assessment: II - A patient with mild systemic                            disease. After reviewing the risks and benefits,  the patient was deemed in satisfactory condition to                            undergo the procedure.                           After obtaining informed consent, the colonoscope                            was passed under direct vision. Throughout the                            procedure, the patient's blood pressure, pulse, and                            oxygen saturations were monitored  continuously. The                            Olympus CF-HQ190L (Serial# [id number]) Colonoscope was                            introduced through the anus and advanced to the 3                            cm into the ileum. A second forward view of the                            right colon was performed. The colonoscopy was                            technically difficult and complex due to a                            redundant colon and significant looping. Successful                            completion of the procedure was aided by changing                            the patient's position, withdrawing and reinserting                            the scope and applying abdominal pressure. The                            patient tolerated the procedure well. The quality                            of the bowel preparation was good. The terminal                            ileum, ileocecal valve, appendiceal orifice, and  rectum were photographed. Scope In: 2:33:04 PM Scope Out: 2:51:58 PM Scope Withdrawal Time: 0 hours 9 minutes 27 seconds  Total Procedure Duration: 0 hours 18 minutes 54 seconds  Findings:                 The perianal and digital rectal examinations were                            normal.                           There was a medium-sized lipoma, in the descending                            colon.                           The colon is redundant and tortuous.                           The examined colonic mucosa otherwise appeared                            normal on direct and retroflexion views. Complications:            No immediate complications. Estimated Blood Loss:     Estimated blood loss: none. Impression:               - The entire examined colon is normal on direct and                            retroflexion views.                           - No specimens collected. Recommendation:           - Patient has a contact number available for                             emergencies. The signs and symptoms of potential                            delayed complications were discussed with the                            patient. Return to normal activities tomorrow.                            Written discharge instructions were provided to the                            patient.                           - Resume previous diet.                           - Continue present medications.                           -  Repeat colonoscopy in 5 years for surveillance                            given the family history.                           - Emerging evidence supports eating a diet of                            fruits, vegetables, grains, calcium, and yogurt                            while reducing red meat and alcohol may reduce the                            risk of colon cancer.                           - Thank you for allowing me to be involved in your                            colon cancer prevention. Tressia DanasKimberly Naziah Weckerly MD, MD 07/08/2022 2:58:58 PM This report has been signed electronically.

## 2022-07-11 ENCOUNTER — Telehealth: Payer: Self-pay | Admitting: *Deleted

## 2022-07-11 NOTE — Telephone Encounter (Signed)
  Follow up Call-     07/08/2022    1:35 PM  Call back number  Post procedure Call Back phone  # [phone]  Permission to leave phone message Yes     Patient questions:  Do you have a fever, pain , or abdominal swelling? No. Pain Score  0 *  Have you tolerated food without any problems? Yes.    Have you been able to return to your normal activities? Yes.    Do you have any questions about your discharge instructions: Diet   No. Medications  No. Follow up visit  No.  Do you have questions or concerns about your Care? No.  Actions: * If pain score is 4 or above: No action needed, pain <4.

## 2022-08-18 ENCOUNTER — Ambulatory Visit: Payer: Self-pay

## 2022-08-18 ENCOUNTER — Ambulatory Visit (INDEPENDENT_AMBULATORY_CARE_PROVIDER_SITE_OTHER): Payer: BC Managed Care – PPO | Admitting: Family Medicine

## 2022-08-18 VITALS — BP 122/80 | HR 90 | Ht 66.0 in | Wt 202.0 lb

## 2022-08-18 DIAGNOSIS — M659 Synovitis and tenosynovitis, unspecified: Secondary | ICD-10-CM

## 2022-08-18 DIAGNOSIS — M722 Plantar fascial fibromatosis: Secondary | ICD-10-CM | POA: Diagnosis not present

## 2022-08-18 MED ORDER — VITAMIN D (ERGOCALCIFEROL) 1.25 MG (50000 UNIT) PO CAPS: 50000.0000 [IU] | ORAL_CAPSULE | ORAL | 0 refills | Status: DC

## 2022-08-18 NOTE — Assessment & Plan Note (Addendum)
Appears to have synovitis of the toes bilaterally.  Likely did have an injury.  X-ray demonstrated as well.  Likely will need custom orthotics with her continuing to have difficulty with her feet.  Follow-up with 6 weeks

## 2022-08-18 NOTE — Patient Instructions (Signed)
Carbon fiber plate HOKA recovery sandals Injected toe today Voltaren and Ice  See me in 6 weeks

## 2022-08-18 NOTE — Progress Notes (Signed)
[name] [address] [place] [address] Phone: [phone] Subjective:    I'm seeing this patient by the request  of:  Farris Has, MD  CC: Foot pain  [id number]  Sharon West is a 46 y.o. female coming in with complaint of bilateral foot pain. Was seen by Dr. Denyse Amass 03/04/2022, had shots in both , pain has started to come back 23-3 moths left is worse than right , pain is at second ray where toes meet foot, does get some numbness and tingling, pain radiates from the foot to the toes , when she wears shoes it helps, ice helps temporarily, advil maybe helps, dos have an antalgic gait       Past Medical History:  Diagnosis Date   Allergy    Arthritis    Rectocele    Vitamin D deficiency    Past Surgical History:  Procedure Laterality Date   CESAREAN SECTION     x 3   REPAIR ANKLE LIGAMENT Left    Social History   Socioeconomic History   Marital status: Married    Spouse name: Clifton Custard   Number of children: 3   Years of education: Not on file   Highest education level: Not on file  Occupational History   Not on file  Tobacco Use   Smoking status: Never   Smokeless tobacco: Never  Vaping Use   Vaping Use: Never used  Substance and Sexual Activity   Alcohol use: Yes    Alcohol/week: 5.0 standard drinks of alcohol    Types: 5 Shots of liquor per week   Drug use: Never   Sexual activity: Yes    Birth control/protection: I.U.D.  Other Topics Concern   Not on file  Social History Narrative   Not on file   Social Determinants of Health   Financial Resource Strain: Not on file  Food Insecurity: Not on file  Transportation Needs: Not on file  Physical Activity: Not on file  Stress: Not on file  Social Connections: Not on file   Allergies  Allergen Reactions   Penicillins    Family History  Problem Relation Age of Onset   Colon polyps Mother    Cancer Mother        skin   Hypertension Mother    Colon cancer  Maternal Grandmother 13   Esophageal cancer Neg Hx    Rectal cancer Neg Hx    Stomach cancer Neg Hx     Current Outpatient Medications (Endocrine & Metabolic):    levonorgestrel (MIRENA) 20 MCG/DAY IUD, by Intrauterine route.      Current Outpatient Medications (Other):    NON FORMULARY, Vitamin D 3, One tablet daily.   NON FORMULARY, Ibuprofen as needed for pain in her feet.   Vitamin D, Ergocalciferol, (DRISDOL) 1.25 MG (50000 UNIT) CAPS capsule, Take 1 capsule (50,000 Units total) by mouth every 7 (seven) days.   Reviewed prior external information including notes and imaging from  primary care provider As well as notes that were available from care everywhere and other healthcare systems.  Past medical history, social, surgical and family history all reviewed in electronic medical record.  No pertanent information unless stated regarding to the chief complaint.   Review of Systems:  No headache, visual changes, nausea, vomiting, diarrhea, constipation, dizziness, abdominal pain, skin rash, fevers, chills, night sweats, weight loss, swollen lymph nodes, body aches, joint swelling, chest pain, shortness of breath, mood changes. POSITIVE muscle aches  Objective  Blood pressure 122/80, pulse 90, height 5\' 6"  (1.676 m), weight 202 lb (91.6 kg), SpO2 97 %.   General: No apparent distress alert and oriented x3 mood and affect normal, dressed appropriately.  HEENT: Pupils equal, extraocular movements intact  Respiratory: Patient's speak in full sentences and does not appear short of breath  Cardiovascular: No lower extremity edema, non tender, no erythema  Patient's foot exam does show some breakdown of the transverse arch noted bilaterally left greater than right.  Morton's toe noted on the left compared to the right.  Patient does have very mild tenderness at the proximal aspect.  No significant midfoot pain.  Limited muscular skeletal ultrasound was performed and interpreted by  , M   Limited ultrasound does show the patient does have hypoechoic changes consistent with tenosynovitis noted left greater than right.  No cortical irregularity noted. Impression: Synovitis of the second toe left greater than right  Procedure: Real-time Ultrasound Guided Injection of left second MTP Device: GE Logiq Q7 Ultrasound guided injection is preferred based studies that show increased duration, increased effect, greater accuracy, decreased procedural pain, increased response rate, and decreased cost with ultrasound guided versus blind injection.  Verbal informed consent obtained.  Time-out conducted.  Noted no overlying erythema, induration, or other signs of local infection.  Skin prepped in a sterile fashion.  Local anesthesia: Topical Ethyl chloride.  With sterile technique and under real time ultrasound guidance: With a 25-gauge half inch needle injected with 0.5 cc of 0.5% Marcaine and 0.5 cc of Kenalog 40 mg/mL Completed without difficulty  Pain immediately resolved suggesting accurate placement of the medication.  Advised to call if fevers/chills, erythema, induration, drainage, or persistent bleeding.  Impression: Technically successful ultrasound guided injection.  Procedure: Real-time Ultrasound Guided Injection of right second MTP Device: GE Logiq Q7 Ultrasound guided injection is preferred based studies that show increased duration, increased effect, greater accuracy, decreased procedural pain, increased response rate, and decreased cost with ultrasound guided versus blind injection.  Verbal informed consent obtained.  Time-out conducted.  Noted no overlying erythema, induration, or other signs of local infection.  Skin prepped in a sterile fashion.  Local anesthesia: Topical Ethyl chloride.  With sterile technique and under real time ultrasound guidance: With a 25-gauge half inch needle injected with 0.5 cc of 0.5% Marcaine and 0.5 cc of Kenalog 40  mg/mL Completed without difficulty  Pain immediately resolved suggesting accurate placement of the medication.  Advised to call if fevers/chills, erythema, induration, drainage, or persistent bleeding.  Impression: Technically successful ultrasound guided injection.    Impression and Recommendations:     The above documentation has been reviewed and is accurate and complete Antoine Primas, DO

## 2022-09-15 ENCOUNTER — Encounter: Payer: Self-pay | Admitting: Family Medicine

## 2022-09-16 ENCOUNTER — Other Ambulatory Visit: Payer: Self-pay

## 2022-09-16 ENCOUNTER — Ambulatory Visit: Payer: BC Managed Care – PPO | Admitting: Family Medicine

## 2022-09-16 MED ORDER — VITAMIN D (ERGOCALCIFEROL) 1.25 MG (50000 UNIT) PO CAPS: 50000.0000 [IU] | ORAL_CAPSULE | ORAL | 0 refills | Status: DC

## 2022-09-29 NOTE — Progress Notes (Signed)
[facility] [address] [place] Phone: [phone] Subjective:   Sharon West, am serving as a scribe for Dr. Hulan Saas.  I'm seeing this patient by the request  of:  London Pepper, MD  CC: bilateral foot pain   [id number]  08/18/2022 Appears to have synovitis of the toes bilaterally.  Likely did have an injury.  X-ray demonstrated as well.  Likely will need custom orthotics with her continuing to have difficulty with her feet.  Follow-up with 6 weeks      Update 09/30/2022 Sharon West is a 47 y.o. female coming in with complaint of B foot pain. Injection in B 2nd MTP joints last visit. Patient states doing well since last injection. Activity level has increased. Starting to feel some inflammation in her arches.        Past Medical History:  Diagnosis Date   Allergy    Arthritis    Rectocele    Vitamin D deficiency    Past Surgical History:  Procedure Laterality Date   CESAREAN SECTION     x 3   REPAIR ANKLE LIGAMENT Left    Social History   Socioeconomic History   Marital status: Married    Spouse name: Marjory Lies   Number of children: 3   Years of education: Not on file   Highest education level: Not on file  Occupational History   Not on file  Tobacco Use   Smoking status: Never   Smokeless tobacco: Never  Vaping Use   Vaping Use: Never used  Substance and Sexual Activity   Alcohol use: Yes    Alcohol/week: 5.0 standard drinks of alcohol    Types: 5 Shots of liquor per week   Drug use: Never   Sexual activity: Yes    Birth control/protection: I.U.D.  Other Topics Concern   Not on file  Social History Narrative   Not on file   Social Determinants of Health   Financial Resource Strain: Not on file  Food Insecurity: Not on file  Transportation Needs: Not on file  Physical Activity: Not on file  Stress: Not on file  Social Connections: Not on file   Allergies  Allergen Reactions    Penicillins    Family History  Problem Relation Age of Onset   Colon polyps Mother    Cancer Mother        skin   Hypertension Mother    Colon cancer Maternal Grandmother 45   Esophageal cancer Neg Hx    Rectal cancer Neg Hx    Stomach cancer Neg Hx     Current Outpatient Medications (Endocrine & Metabolic):    levonorgestrel (MIRENA) 20 MCG/DAY IUD, by Intrauterine route.      Current Outpatient Medications (Other):    NON FORMULARY, Vitamin D 3, One tablet daily.   NON FORMULARY, Ibuprofen as needed for pain in her feet.   Vitamin D, Ergocalciferol, (DRISDOL) 1.25 MG (50000 UNIT) CAPS capsule, Take 1 capsule (50,000 Units total) by mouth every 7 (seven) days.   Reviewed prior external information including notes and imaging from  primary care provider As well as notes that were available from care everywhere and other healthcare systems.  Past medical history, social, surgical and family history all reviewed in electronic medical record.  No pertanent information unless stated regarding to the chief complaint.   Review of Systems:  No headache, visual changes, nausea, vomiting, diarrhea, constipation, dizziness, abdominal pain, skin rash, fevers, chills,  night sweats, weight loss, swollen lymph nodes, body aches, joint swelling, chest pain, shortness of breath, mood changes. POSITIVE muscle aches  Objective  Blood pressure 120/86, pulse 70, height 5' 6"$  (1.676 m), weight 209 lb (94.8 kg), SpO2 98 %.   General: No apparent distress alert and oriented x3 mood and affect normal, dressed appropriately.  HEENT: Pupils equal, extraocular movements intact  Respiratory: Patient's speak in full sentences and does not appear short of breath  Cardiovascular: No lower extremity edema, non tender, no erythema  Right ankle does have some very mild tenderness more at the tarsal tunnel than anywhere else.  No significant pain over the medial calcaneal area. Patient has no pain over the  second metatarsal anymore compared to previous exam on the left side  Limited muscular skeletal ultrasound was performed and interpreted by Hulan Saas, M  Limited ultrasound shows the patient does have a varicose vein near the tarsal tunnel that could be contributing to potential compression.  No significant hypoechoic changes of the posterior tibialis tendon.  Plantar fascia is unremarkable.    Impression and Recommendations:    The above documentation has been reviewed and is accurate and complete Lyndal Pulley, DO

## 2022-09-30 ENCOUNTER — Encounter: Payer: Self-pay | Admitting: Family Medicine

## 2022-09-30 ENCOUNTER — Ambulatory Visit (INDEPENDENT_AMBULATORY_CARE_PROVIDER_SITE_OTHER): Payer: BC Managed Care – PPO | Admitting: Family Medicine

## 2022-09-30 ENCOUNTER — Ambulatory Visit: Payer: Self-pay

## 2022-09-30 VITALS — BP 120/86 | HR 70 | Ht 66.0 in | Wt 209.0 lb

## 2022-09-30 DIAGNOSIS — E559 Vitamin D deficiency, unspecified: Secondary | ICD-10-CM

## 2022-09-30 DIAGNOSIS — M2550 Pain in unspecified joint: Secondary | ICD-10-CM | POA: Diagnosis not present

## 2022-09-30 DIAGNOSIS — M722 Plantar fascial fibromatosis: Secondary | ICD-10-CM | POA: Diagnosis not present

## 2022-09-30 DIAGNOSIS — G5751 Tarsal tunnel syndrome, right lower limb: Secondary | ICD-10-CM | POA: Diagnosis not present

## 2022-09-30 DIAGNOSIS — M659 Synovitis and tenosynovitis, unspecified: Secondary | ICD-10-CM

## 2022-09-30 DIAGNOSIS — G5752 Tarsal tunnel syndrome, left lower limb: Secondary | ICD-10-CM | POA: Insufficient documentation

## 2022-09-30 LAB — VITAMIN D 25 HYDROXY (VIT D DEFICIENCY, FRACTURES): VITD: 35.55 ng/mL (ref 30.00–100.00)

## 2022-09-30 NOTE — Assessment & Plan Note (Signed)
Questionable tarsal tunnel syndrome.  The patient does have a varicose vein in the area that could be contributing.  At this moment though I do think patient can do relatively well with conservative therapy.  I am hoping that this will seem to resolve on its own with some compression socks and home exercises.  Otherwise custom orthotics may be necessary.  Could also consider the possibility of injection if needed.

## 2022-09-30 NOTE — Assessment & Plan Note (Signed)
Completely resolved after injection

## 2022-09-30 NOTE — Assessment & Plan Note (Signed)
Recheck vitamin D today

## 2022-09-30 NOTE — Patient Instructions (Addendum)
Vit D check Do prescribed exercises at least 3x a week Avoid bing barefoot in house 2XU or [place] compression socks See you again in 2 months

## 2022-11-24 NOTE — Progress Notes (Deleted)
Sharon West [place] Phone: [phone] Subjective:    I'm seeing this patient by the request  of:  London Pepper, MD  CC:   [id number]  09/30/2022 Questionable tarsal tunnel syndrome. The patient does have a varicose vein in the area that could be contributing. At this moment though I do think patient can do relatively well with conservative therapy. I am hoping that this will seem to resolve on its own with some compression socks and home exercises. Otherwise custom orthotics may be necessary. Could also consider the possibility of injection if needed.   Update 11/25/2022 Sharon West is a 47 y.o. female coming in with complaint of R ankle pain. Patient states     Past Medical History:  Diagnosis Date   Allergy    Arthritis    Rectocele    Vitamin D deficiency    Past Surgical History:  Procedure Laterality Date   CESAREAN SECTION     x 3   REPAIR ANKLE LIGAMENT Left    Social History   Socioeconomic History   Marital status: Married    Spouse name: Marjory Lies   Number of children: 3   Years of education: Not on file   Highest education level: Not on file  Occupational History   Not on file  Tobacco Use   Smoking status: Never   Smokeless tobacco: Never  Vaping Use   Vaping Use: Never used  Substance and Sexual Activity   Alcohol use: Yes    Alcohol/week: 5.0 standard drinks of alcohol    Types: 5 Shots of liquor per week   Drug use: Never   Sexual activity: Yes    Birth control/protection: I.U.D.  Other Topics Concern   Not on file  Social History Narrative   Not on file   Social Determinants of Health   Financial Resource Strain: Not on file  Food Insecurity: Not on file  Transportation Needs: Not on file  Physical Activity: Not on file  Stress: Not on file  Social Connections: Not on file   Allergies  Allergen Reactions   Penicillins    Family History  Problem Relation Age of Onset    Colon polyps Mother    Cancer Mother        skin   Hypertension Mother    Colon cancer Maternal Grandmother 4   Esophageal cancer Neg Hx    Rectal cancer Neg Hx    Stomach cancer Neg Hx     Current Outpatient Medications (Endocrine & Metabolic):    levonorgestrel (MIRENA) 20 MCG/DAY IUD, by Intrauterine route.      Current Outpatient Medications (Other):    NON FORMULARY, Vitamin D 3, One tablet daily.   NON FORMULARY, Ibuprofen as needed for pain in her feet.   Vitamin D, Ergocalciferol, (DRISDOL) 1.25 MG (50000 UNIT) CAPS capsule, Take 1 capsule (50,000 Units total) by mouth every 7 (seven) days.   Reviewed prior external information including notes and imaging from  primary care provider As well as notes that were available from care everywhere and other healthcare systems.  Past medical history, social, surgical and family history all reviewed in electronic medical record.  No pertanent information unless stated regarding to the chief complaint.   Review of Systems:  No headache, visual changes, nausea, vomiting, diarrhea, constipation, dizziness, abdominal pain, skin rash, fevers, chills, night sweats, weight loss, swollen lymph nodes, body aches, joint swelling, chest pain, shortness of breath, mood changes.  POSITIVE muscle aches  Objective  There were no vitals taken for this visit.   General: No apparent distress alert and oriented x3 mood and affect normal, dressed appropriately.  HEENT: Pupils equal, extraocular movements intact  Respiratory: Patient's speak in full sentences and does not appear short of breath  Cardiovascular: No lower extremity edema, non tender, no erythema      Impression and Recommendations:

## 2022-11-25 ENCOUNTER — Ambulatory Visit: Payer: BC Managed Care – PPO | Admitting: Family Medicine

## 2023-01-02 ENCOUNTER — Ambulatory Visit (INDEPENDENT_AMBULATORY_CARE_PROVIDER_SITE_OTHER): Payer: BC Managed Care – PPO | Admitting: Family Medicine

## 2023-01-02 ENCOUNTER — Other Ambulatory Visit: Payer: Self-pay

## 2023-01-02 VITALS — BP 148/88 | HR 75 | Ht 66.0 in | Wt 213.0 lb

## 2023-01-02 DIAGNOSIS — E559 Vitamin D deficiency, unspecified: Secondary | ICD-10-CM

## 2023-01-02 DIAGNOSIS — M722 Plantar fascial fibromatosis: Secondary | ICD-10-CM | POA: Insufficient documentation

## 2023-01-02 LAB — VITAMIN D 25 HYDROXY (VIT D DEFICIENCY, FRACTURES): VITD: 33.27 ng/mL (ref 30.00–100.00)

## 2023-01-02 NOTE — Patient Instructions (Addendum)
Thank you for coming in today.   You received an injection today. Seek immediate medical attention if the joint becomes red, extremely painful, or is oozing fluid.   Please get labs today before you leave   Check back as needed

## 2023-01-02 NOTE — Progress Notes (Signed)
Rubin Payor, PhD, LAT, ATC acting as a scribe for Clementeen Graham, MD.  Sharon West is a 47 y.o. female who presents to [facility] at [facility] today for cont'd bilat foot pain. Pt was previously seen by Dr. Katrinka Blazing on 09/30/22 for this issue and was given bilat 2nd MTP steroid injections on 08/18/22. Today, pt reports prior PF steroid injections in July that provided much relief. The 2nd MTP steroid injections resolved her pain completely. She is experiencing pain along the plantar aspect of both of her feet. She is also wondering if we could check her vitamin D levels as Dr. Katrinka Blazing had advised supplementation.  She did have a course of prescription vitamin D2 50,000 units weekly.  That has expired and she is not taking any maintenance over-the-counter vitamin D3.   Pertinent review of systems: No fevers or chills  Relevant historical information: History of stress fracture.   Exam:  BP (!) 148/88   Pulse 75   Ht 5\' 6"  (1.676 m)   Wt 213 lb (96.6 kg)   SpO2 98%   BMI 34.38 kg/m  General: Well Developed, well nourished, and in no acute distress.   MSK: Bilateral feet normal-appearing Tender palpation bilateral plantar calcaneus.  Normal foot and ankle motion.    Lab and Radiology Results  Procedure: Real-time Ultrasound Guided Injection of left plantar calcaneus plantar fascia origin Device: Philips Affiniti 50G Images permanently stored and available for review in PACS Verbal informed consent obtained.  Discussed risks and benefits of procedure. Warned about infection, bleeding, hyperglycemia damage to structures among others. Patient expresses understanding and agreement Time-out conducted.   Noted no overlying erythema, induration, or other signs of local infection.   Skin prepped in a sterile fashion.   Local anesthesia: Topical Ethyl chloride.   With sterile technique and under real time ultrasound guidance: 40 mg of Kenalog and 1 mL of Marcaine  injected into plantar fascia origin plantar calcaneus. Fluid seen entering the plantar fascia origin.   Completed without difficulty   Pain immediately resolved suggesting accurate placement of the medication.   Advised to call if fevers/chills, erythema, induration, drainage, or persistent bleeding.   Images permanently stored and available for review in the ultrasound unit.  Impression: Technically successful ultrasound guided injection.    Procedure: Real-time Ultrasound Guided Injection of left plantar fascia origin plantar calcaneus Device: Philips Affiniti 50G Images permanently stored and available for review in PACS Verbal informed consent obtained.  Discussed risks and benefits of procedure. Warned about infection, bleeding, hyperglycemia damage to structures among others. Patient expresses understanding and agreement Time-out conducted.   Noted no overlying erythema, induration, or other signs of local infection.   Skin prepped in a sterile fashion.   Local anesthesia: Topical Ethyl chloride.   With sterile technique and under real time ultrasound guidance: 40 mg of Kenalog and 1 mL of Marcaine injected into plantar fascia origin. Fluid seen entering the anterior calcaneus.   Completed without difficulty   Pain immediately resolved suggesting accurate placement of the medication.   Advised to call if fevers/chills, erythema, induration, drainage, or persistent bleeding.   Images permanently stored and available for review in the ultrasound unit.  Impression: Technically successful ultrasound guided injection.        Assessment and Plan: 47 y.o. female with bilateral heel pain thought to be due to [place] fasciitis bilaterally.  She has maximized her conservative management options at this point.  Plan to proceed with plantar fascia  injection.  She had a similar injection July 2023 which worked very well.  With the pain starts returning again in several months consider  shockwave therapy if needed.  Will go ahead and check her vitamin D after completing course of vitamin D2 50,000 units weekly booster.  Anticipate moving towards over-the-counter vitamin D3 supplementation.   PDMP not reviewed this encounter. Orders Placed This Encounter  Procedures   [place] LIMITED JOINT SPACE STRUCTURES LOW BILAT(NO LINKED CHARGES)    Order Specific Question:   Reason for Exam (SYMPTOM  OR DIAGNOSIS REQUIRED)    Answer:   bilateral foot pain    Order Specific Question:   Preferred imaging location?    Answer:   [facility]   VITAMIN D 25 Hydroxy (Vit-D Deficiency, Fractures)    Standing Status:   Future    Number of Occurrences:   1    Standing Expiration Date:   01/02/2024   No orders of the defined types were placed in this encounter.    Discussed warning signs or symptoms. Please see discharge instructions. Patient expresses understanding.   The above documentation has been reviewed and is accurate and complete Clementeen Graham, M.D.

## 2023-01-03 NOTE — Progress Notes (Signed)
Vitamin D is 33.  This is about the same as it was 3 months ago. Recommend 5000 units over-the-counter vitamin D3 daily.

## 2023-03-29 ENCOUNTER — Encounter: Payer: Self-pay | Admitting: Family Medicine

## 2023-03-29 MED ORDER — PREDNISONE 50 MG PO TABS: 50.0000 mg | ORAL_TABLET | Freq: Every day | ORAL | 0 refills | Status: DC

## 2023-03-30 NOTE — Progress Notes (Addendum)
[name] [address] [place] [address] Phone: [phone] Subjective:   [name], am serving as a scribe for Dr. Antoine Primas.  I'm seeing this patient by the request  of:  Farris Has, MD  CC: left  foot pain   [id number]  09/30/2022 Questionable tarsal tunnel syndrome.  The patient does have a varicose vein in the area that could be contributing.  At this moment though I do think patient can do relatively well with conservative therapy.  I am hoping that this will seem to resolve on its own with some compression socks and home exercises.  Otherwise custom orthotics may be necessary.  Could also consider the possibility of injection if needed.     Update 03/31/2023 Sharon West is a 47 y.o. female coming in with complaint of left  foot pain. Patient states that received injections from Dr. Denyse Amass. The next day she ran to her car and she felt she pulled plantar fasciitis. Entire heel is painful. Put herself back in cam walker which is helpful. Only able to work 1/2 days. On day 2 of prednisone which has been.       Past Medical History:  Diagnosis Date   Allergy    Arthritis    Rectocele    Vitamin D deficiency    Past Surgical History:  Procedure Laterality Date   CESAREAN SECTION     x 3   REPAIR ANKLE LIGAMENT Left    Social History   Socioeconomic History   Marital status: Married    Spouse name: Clifton Custard   Number of children: 3   Years of education: Not on file   Highest education level: Not on file  Occupational History   Not on file  Tobacco Use   Smoking status: Never   Smokeless tobacco: Never  Vaping Use   Vaping status: Never Used  Substance and Sexual Activity   Alcohol use: Yes    Alcohol/week: 5.0 standard drinks of alcohol    Types: 5 Shots of liquor per week   Drug use: Never   Sexual activity: Yes    Birth control/protection: I.U.D.  Other Topics Concern   Not on file  Social History  Narrative   Not on file   Social Determinants of Health   Financial Resource Strain: Not on file  Food Insecurity: Not on file  Transportation Needs: Not on file  Physical Activity: Not on file  Stress: Not on file  Social Connections: Not on file   Allergies  Allergen Reactions   Penicillins    Family History  Problem Relation Age of Onset   Colon polyps Mother    Cancer Mother        skin   Hypertension Mother    Colon cancer Maternal Grandmother 63   Esophageal cancer Neg Hx    Rectal cancer Neg Hx    Stomach cancer Neg Hx     Current Outpatient Medications (Endocrine & Metabolic):    levonorgestrel (MIRENA) 20 MCG/DAY IUD, by Intrauterine route.   predniSONE (DELTASONE) 50 MG tablet, Take 1 tablet (50 mg total) by mouth daily.      Current Outpatient Medications (Other):    NON FORMULARY, Vitamin D 3, One tablet daily.   NON FORMULARY, Ibuprofen as needed for pain in her feet.   Vitamin D, Ergocalciferol, (DRISDOL) 1.25 MG (50000 UNIT) CAPS capsule, Take 1 capsule (50,000 Units total) by mouth every 7 (seven) days.   Reviewed prior  external information including notes and imaging from  primary care provider As well as notes that were available from care everywhere and other healthcare systems.  Past medical history, social, surgical and family history all reviewed in electronic medical record.  No pertanent information unless stated regarding to the chief complaint.   Review of Systems:  No headache, visual changes, nausea, vomiting, diarrhea, constipation, dizziness, abdominal pain, skin rash, fevers, chills, night sweats, weight loss, swollen lymph nodes, body aches, joint swelling, chest pain, shortness of breath, mood changes. POSITIVE muscle aches  Objective  Blood pressure 116/80, pulse 65, height 5\' 6"  (1.676 m).   General: No apparent distress alert and oriented x3 mood and affect normal, dressed appropriately.  HEENT: Pupils equal, extraocular  movements intact  Respiratory: Patient's speak in full sentences and does not appear short of breath  Cardiovascular: No lower extremity edema, non tender, no erythema  Left foot exam shows that there is tenderness to palpation in the heel area and patient does have some hypoechoic changes consistent with swelling of the fat pad.  Patient does have tightness noted in the posterior capsule.  Limited muscular skeletal ultrasound was performed and interpreted by Antoine Primas, M  Limited ultrasound shows that there is some kind of scar tissue formation at the origin of the plantar fascia that is consistent with scar tissue.  No significant enlargement noted of the plantar fascia at this moment.  Patient does have significant hypoechoic changes noted of the fat pad that is consistent with acute inflammation. Impression: Fat pad irritation with healing plantar fascia rupture    Impression and Recommendations:    The above documentation has been reviewed and is accurate and complete Judi Saa, DO  The above documentation has been reviewed and is accurate and complete Judi Saa, DO

## 2023-03-31 ENCOUNTER — Other Ambulatory Visit: Payer: Self-pay | Admitting: Family Medicine

## 2023-03-31 ENCOUNTER — Encounter: Payer: Self-pay | Admitting: Family Medicine

## 2023-03-31 ENCOUNTER — Ambulatory Visit: Payer: BC Managed Care – PPO | Admitting: Family Medicine

## 2023-03-31 ENCOUNTER — Other Ambulatory Visit: Payer: Self-pay

## 2023-03-31 ENCOUNTER — Ambulatory Visit (INDEPENDENT_AMBULATORY_CARE_PROVIDER_SITE_OTHER): Payer: BC Managed Care – PPO

## 2023-03-31 VITALS — BP 116/80 | HR 65 | Ht 66.0 in

## 2023-03-31 DIAGNOSIS — M25571 Pain in right ankle and joints of right foot: Secondary | ICD-10-CM

## 2023-03-31 DIAGNOSIS — Z0184 Encounter for antibody response examination: Secondary | ICD-10-CM | POA: Diagnosis not present

## 2023-03-31 DIAGNOSIS — R7309 Other abnormal glucose: Secondary | ICD-10-CM | POA: Diagnosis not present

## 2023-03-31 DIAGNOSIS — M79671 Pain in right foot: Secondary | ICD-10-CM | POA: Diagnosis not present

## 2023-03-31 DIAGNOSIS — Z0000 Encounter for general adult medical examination without abnormal findings: Secondary | ICD-10-CM | POA: Diagnosis not present

## 2023-03-31 DIAGNOSIS — Z1159 Encounter for screening for other viral diseases: Secondary | ICD-10-CM | POA: Diagnosis not present

## 2023-03-31 DIAGNOSIS — Z13220 Encounter for screening for lipoid disorders: Secondary | ICD-10-CM | POA: Diagnosis not present

## 2023-03-31 DIAGNOSIS — M722 Plantar fascial fibromatosis: Secondary | ICD-10-CM | POA: Diagnosis not present

## 2023-03-31 DIAGNOSIS — M25572 Pain in left ankle and joints of left foot: Secondary | ICD-10-CM | POA: Diagnosis not present

## 2023-03-31 DIAGNOSIS — Z862 Personal history of diseases of the blood and blood-forming organs and certain disorders involving the immune mechanism: Secondary | ICD-10-CM | POA: Diagnosis not present

## 2023-03-31 NOTE — Assessment & Plan Note (Signed)
Has had difficulty with this.  The appearance of patient's left side did have a plantar fascial rupture but seems to be improving.  This was 2 months ago and does show some good scar tissue.  Patient though does have what appears to be a fairly significant inflammation noted of the calcaneal fat pad.  Discussed continuing the prednisone for another 3 days and then transitioning to ibuprofen.  Discussed with patient to continue the cam walker for now and then start the home exercises in 10 days.  Follow-up with me again 6 to 8 weeks otherwise.  If worsening pain would consider an MRI.  X-rays were also ordered today to further evaluate for any bony abnormality that could be contributing.Marland Kitchen

## 2023-03-31 NOTE — Patient Instructions (Addendum)
Good to see you Finish prednisone Then switch to 3IBU 3x  a day for 3 days Xray today Wear for 10 days then can switch to gel heel cup If worsening we will get MRI See me again at end of month

## 2023-04-04 DIAGNOSIS — Z03818 Encounter for observation for suspected exposure to other biological agents ruled out: Secondary | ICD-10-CM | POA: Diagnosis not present

## 2023-04-04 DIAGNOSIS — R059 Cough, unspecified: Secondary | ICD-10-CM | POA: Diagnosis not present

## 2023-04-04 DIAGNOSIS — J019 Acute sinusitis, unspecified: Secondary | ICD-10-CM | POA: Diagnosis not present

## 2023-04-13 NOTE — Progress Notes (Signed)
[facility] [address] [place] [address] Phone: [phone] Subjective:   Sharon West, am serving as a scribe for Dr. Antoine Primas.  I'm seeing this patient by the request  of:  Farris Has, MD  CC:   [id number]  03/31/2023 Has had difficulty with this. The appearance of patient's left side did have a plantar fascial rupture but seems to be improving. This was 2 months ago and does show some good scar tissue. Patient though does have what appears to be a fairly significant inflammation noted of the calcaneal fat pad. Discussed continuing the prednisone for another 3 days and then transitioning to ibuprofen. Discussed with patient to continue the cam walker for now and then start the home exercises in 10 days. Follow-up with me again 6 to 8 weeks otherwise. If worsening pain would consider an MRI. X-rays were also ordered today to further evaluate for any bony abnormality that could be contributing.Marland Kitchen   Updated 04/14/2023 Sharon West is a 47 y.o. female coming in with complaint of L heel and arch pain. Pain is about the same. Took steroid and maybe helped a little. Got a walking boot and not really helping either. No new concerns.      Past Medical History:  Diagnosis Date   Allergy    Arthritis    Rectocele    Vitamin D deficiency    Past Surgical History:  Procedure Laterality Date   CESAREAN SECTION     x 3   REPAIR ANKLE LIGAMENT Left    Social History   Socioeconomic History   Marital status: Married    Spouse name: Clifton Custard   Number of children: 3   Years of education: Not on file   Highest education level: Not on file  Occupational History   Not on file  Tobacco Use   Smoking status: Never   Smokeless tobacco: Never  Vaping Use   Vaping status: Never Used  Substance and Sexual Activity   Alcohol use: Yes    Alcohol/week: 5.0 standard drinks of alcohol    Types: 5 Shots of liquor per week   Drug use: Never    Sexual activity: Yes    Birth control/protection: I.U.D.  Other Topics Concern   Not on file  Social History Narrative   Not on file   Social Determinants of Health   Financial Resource Strain: Not on file  Food Insecurity: Not on file  Transportation Needs: Not on file  Physical Activity: Not on file  Stress: Not on file  Social Connections: Not on file   Allergies  Allergen Reactions   Penicillins    Family History  Problem Relation Age of Onset   Colon polyps Mother    Cancer Mother        skin   Hypertension Mother    Colon cancer Maternal Grandmother 42   Esophageal cancer Neg Hx    Rectal cancer Neg Hx    Stomach cancer Neg Hx     Current Outpatient Medications (Endocrine & Metabolic):    levonorgestrel (MIRENA) 20 MCG/DAY IUD, by Intrauterine route.   predniSONE (DELTASONE) 50 MG tablet, Take 1 tablet (50 mg total) by mouth daily.      Current Outpatient Medications (Other):    NON FORMULARY, Vitamin D 3, One tablet daily.   NON FORMULARY, Ibuprofen as needed for pain in her feet.   Vitamin D, Ergocalciferol, (DRISDOL) 1.25 MG (50000 UNIT) CAPS capsule, Take 1 capsule (50,000  Units total) by mouth every 7 (seven) days.   Reviewed prior external information including notes and imaging from  primary care provider As well as notes that were available from care everywhere and other healthcare systems.  Past medical history, social, surgical and family history all reviewed in electronic medical record.  No pertanent information unless stated regarding to the chief complaint.   Review of Systems:  No headache, visual changes, nausea, vomiting, diarrhea, constipation, dizziness, abdominal pain, skin rash, fevers, chills, night sweats, weight loss, swollen lymph nodes, body aches, joint swelling, chest pain, shortness of breath, mood changes. POSITIVE muscle aches  Objective  Blood pressure 110/64, pulse 92, height 5\' 6"  (1.676 m), weight 215 lb (97.5 kg), SpO2  98%.   General: No apparent distress alert and oriented x3 mood and affect normal, dressed appropriately.  HEENT: Pupils equal, extraocular movements intact  Respiratory: Patient's speak in full sentences and does not appear short of breath  Cardiovascular: No lower extremity edema, non tender, no erythema  Foot exam does show the patient does have some very mild loss of the longitudinal arch noted.  Patient does have some tenderness to palpation noted.  Patient is able to ambulate but does have some pain with ambulation.  Limited muscular skeletal ultrasound was performed and interpreted by Antoine Primas, M   Limited ultrasound shows the patient does have some very mild hypoechoic changes still in the fat pad.  Questionable tear still noted the plantar fascia but difficult to assess.  Hypoechoic changes of the tarsal tunnel is noted but nonspecific.    Impression and Recommendations:     The above documentation has been reviewed and is accurate and complete Judi Saa, DO

## 2023-04-14 ENCOUNTER — Ambulatory Visit: Payer: Self-pay

## 2023-04-14 ENCOUNTER — Encounter: Payer: Self-pay | Admitting: Family Medicine

## 2023-04-14 ENCOUNTER — Ambulatory Visit (INDEPENDENT_AMBULATORY_CARE_PROVIDER_SITE_OTHER): Payer: BC Managed Care – PPO | Admitting: Family Medicine

## 2023-04-14 VITALS — BP 110/64 | HR 92 | Ht 66.0 in | Wt 215.0 lb

## 2023-04-14 DIAGNOSIS — M79672 Pain in left foot: Secondary | ICD-10-CM

## 2023-04-14 DIAGNOSIS — G5751 Tarsal tunnel syndrome, right lower limb: Secondary | ICD-10-CM

## 2023-04-14 DIAGNOSIS — M722 Plantar fascial fibromatosis: Secondary | ICD-10-CM | POA: Diagnosis not present

## 2023-04-14 NOTE — Patient Instructions (Addendum)
Imaging [phone] Call Today  When we receive your results we will contact you.  

## 2023-04-14 NOTE — Assessment & Plan Note (Signed)
I am concerned the patient is having tarsal tunnel syndrome or a potential OCD or potentially even a fracture that does not seem to be healing at this time.  Patient has failed all other conservative therapy at this time.  Has been having this pain for greater than 1 year.  Has failed physical therapy, proper shoes, orthotics and at this point I do feel that advanced imaging is warranted.  Patient will get an MRI and then will follow-up with [place] to further evaluate and discuss.

## 2023-04-26 DIAGNOSIS — Z1231 Encounter for screening mammogram for malignant neoplasm of breast: Secondary | ICD-10-CM | POA: Diagnosis not present

## 2023-04-27 ENCOUNTER — Telehealth: Payer: Self-pay | Admitting: Family Medicine

## 2023-04-27 NOTE — Telephone Encounter (Signed)
Location change is authorized by insurance. Same auth number same expiration date. All in the referral

## 2023-04-27 NOTE — Telephone Encounter (Signed)
Pt called to let us know she had previously scheduled her MRI at [facility] BUT was able to get in earlier at [facility].  Change of location for insurance purposes.

## 2023-05-05 ENCOUNTER — Ambulatory Visit (HOSPITAL_BASED_OUTPATIENT_CLINIC_OR_DEPARTMENT_OTHER)
Admission: RE | Admit: 2023-05-05 | Discharge: 2023-05-05 | Disposition: A | Discharge status: Home / Self Care | Payer: BC Managed Care – PPO | Source: Ambulatory Visit | Attending: Family Medicine | Admitting: Family Medicine

## 2023-05-05 DIAGNOSIS — M65872 Other synovitis and tenosynovitis, left ankle and foot: Secondary | ICD-10-CM | POA: Diagnosis not present

## 2023-05-05 DIAGNOSIS — M79672 Pain in left foot: Secondary | ICD-10-CM

## 2023-05-05 DIAGNOSIS — M25572 Pain in left ankle and joints of left foot: Secondary | ICD-10-CM | POA: Diagnosis not present

## 2023-05-05 DIAGNOSIS — S86312A Strain of muscle(s) and tendon(s) of peroneal muscle group at lower leg level, left leg, initial encounter: Secondary | ICD-10-CM | POA: Diagnosis not present

## 2023-05-05 DIAGNOSIS — S93492A Sprain of other ligament of left ankle, initial encounter: Secondary | ICD-10-CM | POA: Diagnosis not present

## 2023-05-05 DIAGNOSIS — R600 Localized edema: Secondary | ICD-10-CM | POA: Diagnosis not present

## 2023-05-05 DIAGNOSIS — X58XXXA Exposure to other specified factors, initial encounter: Secondary | ICD-10-CM | POA: Insufficient documentation

## 2023-05-07 ENCOUNTER — Encounter: Payer: Self-pay | Admitting: Family Medicine

## 2023-05-09 ENCOUNTER — Encounter: Payer: Self-pay | Admitting: Family Medicine

## 2023-05-09 NOTE — Progress Notes (Deleted)
[name] [address] [place] [address] Phone: [phone] Subjective:    I'm seeing this patient by the request  of:  Sharon Has, MD  CC:   [id number]  04/14/2023 I am concerned the patient is having tarsal tunnel syndrome or a potential OCD or potentially even a fracture that does not seem to be healing at this time.  Patient West failed all other conservative therapy at this time.  West been having this pain for greater than 1 year.  West failed physical therapy, proper shoes, orthotics and at this point I do feel that advanced imaging is warranted.  Patient will get an MRI and then will follow-up with Sharon West to further evaluate and discuss.      Update 05/10/2023 Sharon West is a 47 y.o. female coming in with complaint of R ankle pain. Patient states       Past Medical History:  Diagnosis Date   Allergy    Arthritis    Rectocele    Vitamin D deficiency    Past Surgical History:  Procedure Laterality Date   CESAREAN SECTION     x 3   REPAIR ANKLE LIGAMENT Left    Social History   Socioeconomic History   Marital status: Married    Spouse name: Sharon West   Number of children: 3   Years of education: Not on file   Highest education level: Not on file  Occupational History   Not on file  Tobacco Use   Smoking status: Never   Smokeless tobacco: Never  Vaping Use   Vaping status: Never Used  Substance and Sexual Activity   Alcohol use: Yes    Alcohol/week: 5.0 standard drinks of alcohol    Types: 5 Shots of liquor per week   Drug use: Never   Sexual activity: Yes    Birth control/protection: I.U.D.  Other Topics Concern   Not on file  Social History Narrative   Not on file   Social Determinants of Health   Financial Resource Strain: Not on file  Food Insecurity: Not on file  Transportation Needs: Not on file  Physical Activity: Not on file  Stress: Not on file  Social Connections: Not on file   Allergies   Allergen Reactions   Penicillins    Family History  Problem Relation Age of Onset   Colon polyps Mother    Cancer Mother        skin   Hypertension Mother    Colon cancer Maternal Grandmother 30   Esophageal cancer Neg Hx    Rectal cancer Neg Hx    Stomach cancer Neg Hx     Current Outpatient Medications (Endocrine & Metabolic):    levonorgestrel (MIRENA) 20 MCG/DAY IUD, by Intrauterine route.   predniSONE (DELTASONE) 50 MG tablet, Take 1 tablet (50 mg total) by mouth daily.      Current Outpatient Medications (Other):    NON FORMULARY, Vitamin D 3, One tablet daily.   NON FORMULARY, Ibuprofen as needed for pain in her feet.   Vitamin D, Ergocalciferol, (DRISDOL) 1.25 MG (50000 UNIT) CAPS capsule, Take 1 capsule (50,000 Units total) by mouth every 7 (seven) days.   Reviewed prior external information including notes and imaging from  primary care provider As well as notes that were available from care everywhere and other healthcare systems.  Past medical history, social, surgical and family history all reviewed in electronic medical record.  No pertanent information unless stated regarding to  the chief complaint.   Review of Systems:  No headache, visual changes, nausea, vomiting, diarrhea, constipation, dizziness, abdominal pain, skin rash, fevers, chills, night sweats, weight loss, swollen lymph nodes, body aches, joint swelling, chest pain, shortness of breath, mood changes. POSITIVE muscle aches  Objective  There were no vitals taken for this visit.   General: No apparent distress alert and oriented x3 mood and affect normal, dressed appropriately.  HEENT: Pupils equal, extraocular movements intact  Respiratory: Patient's speak in full sentences and does not appear short of breath  Cardiovascular: No lower extremity edema, non tender, no erythema      Impression and Recommendations:

## 2023-05-10 ENCOUNTER — Ambulatory Visit: Payer: BC Managed Care – PPO | Admitting: Family Medicine

## 2023-05-12 ENCOUNTER — Other Ambulatory Visit: Payer: BC Managed Care – PPO

## 2023-05-12 ENCOUNTER — Other Ambulatory Visit (HOSPITAL_BASED_OUTPATIENT_CLINIC_OR_DEPARTMENT_OTHER): Payer: Self-pay

## 2023-05-12 MED ORDER — INFLUENZA VIRUS VACC SPLIT PF (FLUZONE) 0.5 ML IM SUSY
0.5000 mL | PREFILLED_SYRINGE | Freq: Once | INTRAMUSCULAR | 0 refills | Status: AC
  Filled 2023-05-12: qty 0.5, 1d supply, fill #0

## 2023-05-24 ENCOUNTER — Encounter (HOSPITAL_BASED_OUTPATIENT_CLINIC_OR_DEPARTMENT_OTHER): Payer: Self-pay | Admitting: *Deleted

## 2023-05-31 ENCOUNTER — Ambulatory Visit: Payer: BC Managed Care – PPO | Admitting: Family Medicine

## 2023-06-02 DIAGNOSIS — M9905 Segmental and somatic dysfunction of pelvic region: Secondary | ICD-10-CM | POA: Diagnosis not present

## 2023-06-02 DIAGNOSIS — L720 Epidermal cyst: Secondary | ICD-10-CM | POA: Diagnosis not present

## 2023-06-02 DIAGNOSIS — M62830 Muscle spasm of back: Secondary | ICD-10-CM | POA: Diagnosis not present

## 2023-06-02 DIAGNOSIS — D225 Melanocytic nevi of trunk: Secondary | ICD-10-CM | POA: Diagnosis not present

## 2023-06-02 DIAGNOSIS — L578 Other skin changes due to chronic exposure to nonionizing radiation: Secondary | ICD-10-CM | POA: Diagnosis not present

## 2023-06-02 DIAGNOSIS — M9902 Segmental and somatic dysfunction of thoracic region: Secondary | ICD-10-CM | POA: Diagnosis not present

## 2023-06-02 DIAGNOSIS — M9903 Segmental and somatic dysfunction of lumbar region: Secondary | ICD-10-CM | POA: Diagnosis not present

## 2023-06-02 DIAGNOSIS — L821 Other seborrheic keratosis: Secondary | ICD-10-CM | POA: Diagnosis not present

## 2023-06-02 DIAGNOSIS — M9906 Segmental and somatic dysfunction of lower extremity: Secondary | ICD-10-CM | POA: Diagnosis not present

## 2023-06-09 NOTE — Progress Notes (Unsigned)
[name] [address] [place] [address] Phone: [phone] Subjective:   Sharon West, am serving as a scribe for Dr. Antoine Primas.  I'm seeing this patient by the request  of:  Farris Has, MD  CC: left heel pain   [id number]  04/14/2023 I am concerned the patient is having tarsal tunnel syndrome or a potential OCD or potentially even a fracture that does not seem to be healing at this time.  Patient has failed all other conservative therapy at this time.  Has been having this pain for greater than 1 year.  Has failed physical therapy, proper shoes, orthotics and at this point I do feel that advanced imaging is warranted.  Patient will get an MRI and then will follow-up with [place] to further evaluate and discuss.      Update 06/12/2023 Sharon West is a 47 y.o. female coming in with complaint of L heel pain. Patient states go over MRI results. Gotten to the point where she's not going to work.    Mri  IMPRESSION: 1. Longitudinal split tear of the peroneus brevis with associated tenosynovitis. 2. Chronic appearing high-grade tear of the anterior talofibular ligament from the fibula. 3. Mild subcutaneous edema about the medial ankle is nonspecific but could be due to a contusion.  Past Medical History:  Diagnosis Date   Allergy    Arthritis    Rectocele    Vitamin D deficiency    Past Surgical History:  Procedure Laterality Date   CESAREAN SECTION     x 3   REPAIR ANKLE LIGAMENT Left    Social History   Socioeconomic History   Marital status: Married    Spouse name: Clifton Custard   Number of children: 3   Years of education: Not on file   Highest education level: Not on file  Occupational History   Not on file  Tobacco Use   Smoking status: Never   Smokeless tobacco: Never  Vaping Use   Vaping status: Never Used  Substance and Sexual Activity   Alcohol use: Yes    Alcohol/week: 5.0 standard drinks of alcohol     Types: 5 Shots of liquor per week   Drug use: Never   Sexual activity: Yes    Birth control/protection: I.U.D.  Other Topics Concern   Not on file  Social History Narrative   Not on file   Social Determinants of Health   Financial Resource Strain: Not on file  Food Insecurity: Not on file  Transportation Needs: Not on file  Physical Activity: Not on file  Stress: Not on file  Social Connections: Not on file   Allergies  Allergen Reactions   Penicillins    Family History  Problem Relation Age of Onset   Colon polyps Mother    Cancer Mother        skin   Hypertension Mother    Colon cancer Maternal Grandmother 98   Esophageal cancer Neg Hx    Rectal cancer Neg Hx    Stomach cancer Neg Hx     Current Outpatient Medications (Endocrine & Metabolic):    levonorgestrel (MIRENA) 20 MCG/DAY IUD, by Intrauterine route.   predniSONE (DELTASONE) 50 MG tablet, Take 1 tablet (50 mg total) by mouth daily.      Current Outpatient Medications (Other):    AMBULATORY NON FORMULARY MEDICATION, 1 Units by Other route once for 1 dose.   NON FORMULARY, Vitamin D 3, One tablet daily.  NON FORMULARY, Ibuprofen as needed for pain in her feet.   Vitamin D, Ergocalciferol, (DRISDOL) 1.25 MG (50000 UNIT) CAPS capsule, Take 1 capsule (50,000 Units total) by mouth every 7 (seven) days.   Reviewed prior external information including notes and imaging from  primary care provider As well as notes that were available from care everywhere and other healthcare systems.  Past medical history, social, surgical and family history all reviewed in electronic medical record.  No pertanent information unless stated regarding to the chief complaint.   Review of Systems:  No headache, visual changes, nausea, vomiting, diarrhea, constipation, dizziness, abdominal pain, skin rash, fevers, chills, night sweats, weight loss, swollen lymph nodes, body aches, joint swelling, chest pain, shortness of breath,  mood changes. POSITIVE muscle aches  Objective  Blood pressure 118/78, pulse 87, height 5\' 6"  (1.676 m), weight 218 lb (98.9 kg), SpO2 97%.   General: No apparent distress alert and oriented x3 mood and affect normal, dressed appropriately.  HEENT: Pupils equal, extraocular movements intact  Respiratory: Patient's speak in full sentences and does not appear short of breath  Cardiovascular: No lower extremity edema, non tender, no erythema  Left heel tender to palpation more over the plantar and lateral aspect of it.  No masses appreciated.  Patient does have tenderness in this area.  Good range of motion of the ankle.  Does have some mild anterior drawer test of the ankle.  Irritable over the tarsal tunnel but no true tenderness noted today.  Procedure: Real-time Ultrasound Guided Injection of right tarsal tunnel Device: GE Logiq Q7 Ultrasound guided injection is preferred based studies that show increased duration, increased effect, greater accuracy, decreased procedural pain, increased response rate, and decreased cost with ultrasound guided versus blind injection.  Verbal informed consent obtained.  Time-out conducted.  Noted no overlying erythema, induration, or other signs of local infection.  Skin prepped in a sterile fashion.  Local anesthesia: Topical Ethyl chloride.  With sterile technique and under real time ultrasound guidance: With a 25-gauge half inch needle injected with 0.5 cc of 0.5% Marcaine and 0.5 cc of Kenalog 40 mg/mL Completed without difficulty  Pain immediately resolved suggesting accurate placement of the medication.  Advised to call if fevers/chills, erythema, induration, drainage, or persistent bleeding.  Impression: Technically successful ultrasound guided injection.  Procedure: Real-time Ultrasound Guided Injection of left recurrent peroneal nerve at the lateral heel Device: GE Logiq Q7 Ultrasound guided injection is preferred based studies that show increased  duration, increased effect, greater accuracy, decreased procedural pain, increased response rate, and decreased cost with ultrasound guided versus blind injection.  Verbal informed consent obtained.  Time-out conducted.  Noted no overlying erythema, induration, or other signs of local infection.  Skin prepped in a sterile fashion.  Local anesthesia: Topical Ethyl chloride.  With sterile technique and under real time ultrasound guidance: With a 25-gauge half inch needle injected with 0.5 cc of 0.5% Marcaine and 0.5 cc of Kenalog 40 mg/mL Completed without difficulty  Pain immediately resolved suggesting accurate placement of the medication.  Advised to call if fevers/chills, erythema, induration, drainage, or persistent bleeding.  Impression: Technically successful ultrasound guided injection.    Impression and Recommendations:     The above documentation has been reviewed and is accurate and complete Judi Saa, DO

## 2023-06-12 ENCOUNTER — Ambulatory Visit: Payer: BC Managed Care – PPO | Admitting: Family Medicine

## 2023-06-12 ENCOUNTER — Encounter: Payer: Self-pay | Admitting: Family Medicine

## 2023-06-12 ENCOUNTER — Other Ambulatory Visit: Payer: Self-pay

## 2023-06-12 VITALS — BP 118/78 | HR 87 | Ht 66.0 in | Wt 218.0 lb

## 2023-06-12 DIAGNOSIS — M79672 Pain in left foot: Secondary | ICD-10-CM

## 2023-06-12 DIAGNOSIS — M2550 Pain in unspecified joint: Secondary | ICD-10-CM | POA: Diagnosis not present

## 2023-06-12 DIAGNOSIS — G5752 Tarsal tunnel syndrome, left lower limb: Secondary | ICD-10-CM | POA: Diagnosis not present

## 2023-06-12 LAB — COMPREHENSIVE METABOLIC PANEL
ALT: 19 U/L (ref 0–35)
AST: 17 U/L (ref 0–37)
Albumin: 4.4 g/dL (ref 3.5–5.2)
Alkaline Phosphatase: 53 U/L (ref 39–117)
BUN: 15 mg/dL (ref 6–23)
CO2: 26 meq/L (ref 19–32)
Calcium: 9.5 mg/dL (ref 8.4–10.5)
Chloride: 103 meq/L (ref 96–112)
Creatinine, Ser: 0.87 mg/dL (ref 0.40–1.20)
GFR: 79.97 mL/min (ref >60.00)
Glucose, Bld: 107 mg/dL — ABNORMAL HIGH (ref 70–99)
Potassium: 4.1 meq/L (ref 3.5–5.1)
Sodium: 137 meq/L (ref 135–145)
Total Bilirubin: 0.3 mg/dL (ref 0.2–1.2)
Total Protein: 7.5 g/dL (ref 6.0–8.3)

## 2023-06-12 LAB — CBC WITH DIFFERENTIAL/PLATELET
Basophils Absolute: 0 10*3/uL (ref 0.0–0.1)
Basophils Relative: 0.7 % (ref 0.0–3.0)
Eosinophils Absolute: 0.1 10*3/uL (ref 0.0–0.7)
Eosinophils Relative: 2.4 % (ref 0.0–5.0)
HCT: 38.9 % (ref 36.0–46.0)
Hemoglobin: 12.6 g/dL (ref 12.0–15.0)
Lymphocytes Relative: 28.3 % (ref 12.0–46.0)
Lymphs Abs: 1.6 10*3/uL (ref 0.7–4.0)
MCHC: 32.4 g/dL (ref 30.0–36.0)
MCV: 92.9 fL (ref 78.0–100.0)
Monocytes Absolute: 0.4 10*3/uL (ref 0.1–1.0)
Monocytes Relative: 7.1 % (ref 3.0–12.0)
Neutro Abs: 3.5 10*3/uL (ref 1.4–7.7)
Neutrophils Relative %: 61.5 % (ref 43.0–77.0)
Platelets: 337 10*3/uL (ref 150.0–400.0)
RBC: 4.18 Mil/uL (ref 3.87–5.11)
RDW: 12.7 % (ref 11.5–15.5)
WBC: 5.7 10*3/uL (ref 4.0–10.5)

## 2023-06-12 LAB — SEDIMENTATION RATE: Sed Rate: 23 mm/h — ABNORMAL HIGH (ref 0–20)

## 2023-06-12 LAB — C-REACTIVE PROTEIN: CRP: <1 mg/dL (ref 0.5–20.0)

## 2023-06-12 LAB — IBC PANEL
Iron: 75 ug/dL (ref 42–145)
Saturation Ratios: 19.7 % — ABNORMAL LOW (ref 20.0–50.0)
TIBC: 380.8 ug/dL (ref 250.0–450.0)
Transferrin: 272 mg/dL (ref 212.0–360.0)

## 2023-06-12 LAB — TSH: TSH: 1.54 u[IU]/mL (ref 0.35–5.50)

## 2023-06-12 LAB — VITAMIN D 25 HYDROXY (VIT D DEFICIENCY, FRACTURES): VITD: 36.5 ng/mL (ref 30.00–100.00)

## 2023-06-12 LAB — URIC ACID: Uric Acid, Serum: 4.7 mg/dL (ref 2.4–7.0)

## 2023-06-12 LAB — VITAMIN B12: Vitamin B-12: 261 pg/mL (ref 211–911)

## 2023-06-12 LAB — FERRITIN: Ferritin: 77.8 ng/mL (ref 10.0–291.0)

## 2023-06-12 MED ORDER — AMBULATORY NON FORMULARY MEDICATION: 1.0000 [IU] | Freq: Once | 0 refills | Status: AC

## 2023-06-12 NOTE — Patient Instructions (Addendum)
Injection in foot today Labs todays Referral Neurology  See you again in 3 weeks  [facility] [address] [place], [place] [address] [phone]  [facility] Supply-Does not file insurance [address] [place], [place] [address] [phone]

## 2023-06-12 NOTE — Assessment & Plan Note (Signed)
Patient given injection today.  Tolerated the procedure well.  Also did a lateral nerve injection and hopeful that this will make a difference as well.  Difficult to say what is potentially contributing to all of her pain but does have lateral column overload secondary to the peroneal tendon tear as well as the ATFL tear.  We discussed with patient that if worsening symptoms we do need to monitor closely.  Patient would consider surgical intervention.  Will Pait make patient nonweightbearing for the next 2 weeks to see how patient does.  Could consider the possibility of gabapentin.  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  If no improvement patient is open to discuss with a surgeon

## 2023-06-15 LAB — ANTI-NUCLEAR AB-TITER (ANA TITER): ANA Titer 1: 1:40 {titer} — ABNORMAL HIGH

## 2023-06-15 LAB — CYCLIC CITRUL PEPTIDE ANTIBODY, IGG: Cyclic Citrullin Peptide Ab: <16 U

## 2023-06-15 LAB — ANA: Anti Nuclear Antibody (ANA): POSITIVE — AB

## 2023-06-15 LAB — CALCIUM, IONIZED: Calcium, Ion: 5.1 mg/dL (ref 4.7–5.5)

## 2023-06-15 LAB — PTH, INTACT AND CALCIUM
Calcium: 9.8 mg/dL (ref 8.6–10.2)
PTH: 36 pg/mL (ref 16–77)

## 2023-06-15 LAB — RHEUMATOID FACTOR: Rheumatoid fact SerPl-aCnc: 10 [IU]/mL (ref <14)

## 2023-06-15 LAB — ANGIOTENSIN CONVERTING ENZYME: Angiotensin-Converting Enzyme: 30 U/L (ref 9–67)

## 2023-06-20 ENCOUNTER — Encounter: Payer: Self-pay | Admitting: Family Medicine

## 2023-06-20 ENCOUNTER — Other Ambulatory Visit: Payer: Self-pay

## 2023-06-20 DIAGNOSIS — R202 Paresthesia of skin: Secondary | ICD-10-CM

## 2023-06-20 MED ORDER — MELOXICAM 15 MG PO TABS: 15.0000 mg | ORAL_TABLET | Freq: Every day | ORAL | 0 refills | Status: DC

## 2023-06-23 ENCOUNTER — Ambulatory Visit (HOSPITAL_BASED_OUTPATIENT_CLINIC_OR_DEPARTMENT_OTHER): Payer: BC Managed Care – PPO | Admitting: Obstetrics & Gynecology

## 2023-06-23 ENCOUNTER — Encounter (HOSPITAL_BASED_OUTPATIENT_CLINIC_OR_DEPARTMENT_OTHER): Payer: Self-pay | Admitting: Obstetrics & Gynecology

## 2023-06-23 ENCOUNTER — Ambulatory Visit (INDEPENDENT_AMBULATORY_CARE_PROVIDER_SITE_OTHER): Payer: BC Managed Care – PPO | Admitting: Neurology

## 2023-06-23 VITALS — BP 127/77 | HR 73 | Ht 66.25 in | Wt 218.2 lb

## 2023-06-23 DIAGNOSIS — Z800 Family history of malignant neoplasm of digestive organs: Secondary | ICD-10-CM

## 2023-06-23 DIAGNOSIS — Z8742 Personal history of other diseases of the female genital tract: Secondary | ICD-10-CM

## 2023-06-23 DIAGNOSIS — R202 Paresthesia of skin: Secondary | ICD-10-CM | POA: Diagnosis not present

## 2023-06-23 DIAGNOSIS — Z975 Presence of (intrauterine) contraceptive device: Secondary | ICD-10-CM | POA: Diagnosis not present

## 2023-06-23 DIAGNOSIS — E559 Vitamin D deficiency, unspecified: Secondary | ICD-10-CM

## 2023-06-23 DIAGNOSIS — Z01419 Encounter for gynecological examination (general) (routine) without abnormal findings: Secondary | ICD-10-CM | POA: Diagnosis not present

## 2023-06-23 NOTE — Progress Notes (Signed)
47 y.o. G31P3003 Married White or Caucasian female here for annual exam.  Doing well.  Having left heel pain.  Has had injections and activity modification.  Had foot MRI.  Having nerve conduction study with neurology this afternoon.   Has Mirena IUD removed/replaced.  She has occasional spotting.  This is light    No LMP recorded. (Menstrual status: IUD).          The current method of family planning is IUD.    Smoker:  no  Health Maintenance: Pap:  06/10/2022 Negative History of abnormal Pap:  no MMG:  04/26/2023 Negative Colonoscopy:  07/08/2022 Screening Labs: significant amount of lab work done this year.   reports that she has never smoked. She has never used smokeless tobacco. She reports current alcohol use of about 5.0 standard drinks of alcohol per week. She reports that she does not use drugs.  Past Medical History:  Diagnosis Date   Allergy    Arthritis    Rectocele    Vitamin D deficiency     Past Surgical History:  Procedure Laterality Date   CESAREAN SECTION     x 3   REPAIR ANKLE LIGAMENT Left     Current Outpatient Medications  Medication Sig Dispense Refill   levonorgestrel (MIRENA) 20 MCG/DAY IUD by Intrauterine route.     meloxicam (MOBIC) 15 MG tablet Take 1 tablet (15 mg total) by mouth daily. 30 tablet 0   NON FORMULARY Vitamin D 3, One tablet daily.     NON FORMULARY Ibuprofen as needed for pain in her feet.     No current facility-administered medications for this visit.    Family History  Problem Relation Age of Onset   Colon polyps Mother    Cancer Mother        skin   Hypertension Mother    Colon cancer Maternal Grandmother 32   Esophageal cancer Neg Hx    Rectal cancer Neg Hx    Stomach cancer Neg Hx     ROS: Constitutional: negative Genitourinary:negative  Exam:   BP 127/77 (BP Location: Right Arm, Patient Position: Sitting, Cuff Size: Large)   Pulse 73   Ht 5' 6.25" (1.683 m)   Wt 218 lb 3.2 oz (99 kg)   BMI 34.95 kg/m    Height: 5' 6.25" (168.3 cm)  General appearance: alert, cooperative and appears stated age Head: Normocephalic, without obvious abnormality, atraumatic Neck: no adenopathy, supple, symmetrical, trachea midline and thyroid normal to inspection and palpation Lungs: clear to auscultation bilaterally Breasts: normal appearance, no masses or tenderness Heart: regular rate and rhythm Abdomen: soft, non-tender; bowel sounds normal; no masses,  no organomegaly Extremities: extremities normal, atraumatic, no cyanosis or edema Skin: Skin color, texture, turgor normal. No rashes or lesions Lymph nodes: Cervical, supraclavicular, and axillary nodes normal. No abnormal inguinal nodes palpated Neurologic: Grossly normal   Pelvic: External genitalia:  no lesions              Urethra:  normal appearing urethra with no masses, tenderness or lesions              Bartholins and Skenes: normal                 Vagina: normal appearing vagina with normal color and no discharge, no lesions              Cervix: no lesions              Pap taken: No.  Bimanual Exam:  Uterus:  normal size, contour, position, consistency, mobility, non-tender              Adnexa: normal adnexa and no mass, fullness, tenderness               Rectovaginal: Confirms               Anus:  normal sphincter tone, no lesions  Chaperone, Ina Homes, CMA, was present for exam.  Assessment/Plan: 1. Well woman exam with routine gynecological exam - Pap smear neg with neg HR HPV 2023.  Not indicated today. - Mammogram 04/2023 - Colonoscopy 06/2022.  Follow up 5 years - lab work done this year - vaccines reviewed/updated  2. IUD (intrauterine device) in place - mirena replaced 2023  3. History of menorrhagia - under good control with Mirena IUD  4. Family history of colon cancer  5. Vitamin D deficiency - taking supplement

## 2023-06-23 NOTE — Procedures (Signed)
  [facility]  [address] [place], [address]  [place], [place] [address] Tel: [phone] Fax: [phone] Test Date:  06/23/2023  Patient: Sharon West DOB: [birth year] Physician: Nita Sickle, DO  Sex: Female Height: 5\' 6"  Ref Phys: Antoine Primas, DO  ID#: [id number]   Technician:    History: This is a 47 year old female referred for evaluation of left heel pain.  NCV & EMG Findings: Extensive electrodiagnostic testing of the left lower extremity shows:  Left sural, superficial peroneal, medial plantar, and lateral plantar sensory responses are within normal limits. Left peroneal and tibial motor responses are within normal limits. Left tibial H reflex study is within normal limits. There is no evidence of active or chronic motor axonal loss changes affecting any of the tested muscles.  Motor unit configuration and recruitment pattern is within normal limits.  Impression: This is a normal study of the left lower extremity.  In particular, there is no evidence of tarsal tunnel syndrome, lumbosacral radiculopathy, or large fiber sensorimotor polyneuropathy.   ___________________________ Nita Sickle, DO    Nerve Conduction Studies   Stim Site NR Peak (ms) Norm Peak (ms) O-P Amp (V) Norm O-P Amp  Left Sup Peroneal Anti Sensory (Ant Lat Mall)  32 C  12 cm    2.0 <4.5 13.6 >5  Left Sural Anti Sensory (Lat Mall)  32 C  Calf    2.6 <4.5 14.7 >5     Stim Site NR Onset (ms) Norm Onset (ms) O-P Amp (mV) Norm O-P Amp Site1 Site2 Delta-0 (ms) Dist (cm) Vel (m/s) Norm Vel (m/s)  Left Peroneal Motor (Ext Dig Brev)  32 C  Ankle    3.0 <5.5 7.6 >3 B Fib Ankle 6.0 34.0 57 >40  B Fib    9.0  6.8  Poplt B Fib 1.5 8.0 53 >40  Poplt    10.5  6.7         Left Tibial Motor (Abd Hall Brev)  32 C  Ankle    3.7 <6.0 11.4 >8 Knee Ankle 6.3 41.0 65 >40  Knee    10.0  10.1         Left Tibial (ADQP) Motor (ADQP)  32 C  Ankle    3.5 <6.5 5.5 >4 Knee Ankle 7.4 41.0 55 >40  Knee     10.9  3.2            Stim Site NR Peak (ms) Norm Peak (ms) P-T Amp (V) Norm P-T Amp  Left Lateral Plantar Mixed (Med Malleolus)  32 C  Lateral Foot    2.4 <3.7 11.0 >8  Left Medial Plantar Mixed (Med Malleolus)  32 C  Medial Foot    2.4 <3.7 10.2 >8   Electromyography   Side Muscle Ins.Act Fibs Fasc Recrt Amp Dur Poly Activation Comment  Left AntTibialis Nml Nml Nml Nml Nml Nml Nml Nml N/A  Left Gastroc Nml Nml Nml Nml Nml Nml Nml Nml N/A  Left Flex Dig Long Nml Nml Nml Nml Nml Nml Nml Nml N/A  Left RectFemoris Nml Nml Nml Nml Nml Nml Nml Nml N/A  Left AbdDigQuinti Nml Nml Nml Nml Nml Nml Nml Nml N/A  Left AbdHallucis Nml Nml Nml Nml Nml Nml Nml Nml N/A      Waveforms:

## 2023-06-26 ENCOUNTER — Encounter: Payer: Self-pay | Admitting: Family Medicine

## 2023-06-29 NOTE — Progress Notes (Signed)
[name] [address] [place] [address] Phone: [phone] Subjective:   Sharon West, am serving as a scribe for Dr. Antoine Primas.  I'm seeing this patient by the request  of:  Farris Has, MD  CC: Left heel pain follow-up  [id number]  06/12/2023 Patient given injection today.  Tolerated the procedure well.  Also did a lateral nerve injection and hopeful that this will make a difference as well.  Difficult to say what is potentially contributing to all of her pain but does have lateral column overload secondary to the peroneal tendon tear as well as the ATFL tear.  We discussed with patient that if worsening symptoms we do need to monitor closely.  Patient would consider surgical intervention.  Will Pait make patient nonweightbearing for the next 2 weeks to see how patient does.  Could consider the possibility of gabapentin.  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  If no improvement patient is open to discuss with a surgeon      Sharon West is a 47 y.o. female coming in with complaint of L heel pain. EMG on 06/23/2023.  EMG showed no significant findings consistent with tarsal tunnel syndrome or lumbar radiculopathy.  Patient does states that he did have the worsening pain when she was having the nerve conduction test.  Patient states that she used rolling scooter for 2 weeks which was helpful. Injections were helpful for 1 week. Pain with weightbearing returned to the same as it was prior to getting scooter.     MRI of the ankle in September did show a longitudinal split tear of the peroneal brevis and a high-grade tear of the ATFL  Past Medical History:  Diagnosis Date   Allergy    Arthritis    Rectocele    Vitamin D deficiency    Past Surgical History:  Procedure Laterality Date   CESAREAN SECTION     x 3   REPAIR ANKLE LIGAMENT Left    Social History   Socioeconomic History   Marital status:  Married    Spouse name: Clifton Custard   Number of children: 3   Years of education: Not on file   Highest education level: Not on file  Occupational History   Not on file  Tobacco Use   Smoking status: Never   Smokeless tobacco: Never  Vaping Use   Vaping status: Never Used  Substance and Sexual Activity   Alcohol use: Yes    Alcohol/week: 5.0 standard drinks of alcohol    Types: 5 Shots of liquor per week   Drug use: Never   Sexual activity: Yes    Birth control/protection: I.U.D.  Other Topics Concern   Not on file  Social History Narrative   Not on file   Social Determinants of Health   Financial Resource Strain: Not on file  Food Insecurity: Not on file  Transportation Needs: Not on file  Physical Activity: Not on file  Stress: Not on file  Social Connections: Not on file   Allergies  Allergen Reactions   Penicillins    Family History  Problem Relation Age of Onset   Colon polyps Mother    Cancer Mother        skin   Hypertension Mother    Colon cancer Maternal Grandmother 67   Esophageal cancer Neg Hx    Rectal cancer Neg Hx    Stomach cancer Neg Hx     Current Outpatient  Medications (Endocrine & Metabolic):    levonorgestrel (MIRENA) 20 MCG/DAY IUD, by Intrauterine route.    Current Outpatient Medications (Analgesics):    meloxicam (MOBIC) 15 MG tablet, Take 1 tablet (15 mg total) by mouth daily.   Current Outpatient Medications (Other):    DULoxetine (CYMBALTA) 20 MG capsule, Take 1 capsule (20 mg total) by mouth daily.   NON FORMULARY, Vitamin D 3, One tablet daily.   NON FORMULARY, Ibuprofen as needed for pain in her feet.   Reviewed prior external information including notes and imaging from  primary care provider As well as notes that were available from care everywhere and other healthcare systems.  Past medical history, social, surgical and family history all reviewed in electronic medical record.  No pertanent information unless stated  regarding to the chief complaint.   Review of Systems:  No headache, visual changes, nausea, vomiting, diarrhea, constipation, dizziness, abdominal pain, skin rash, fevers, chills, night sweats, weight loss, swollen lymph nodes, body aches, joint swelling, chest pain, shortness of breath, mood changes. POSITIVE muscle aches  Objective  Blood pressure 116/76, pulse 74, height 5\' 6"  (1.676 m), weight 233 lb (105.7 kg), SpO2 95%.   General: No apparent distress alert and oriented x3 mood and affect normal, dressed appropriately.  HEENT: Pupils equal, extraocular movements intact  Respiratory: Patient's speak in full sentences and does not appear short of breath  Cardiovascular: No lower extremity edema, non tender, no erythema  Ankle exam shows positive Tinel's over the tarsal tunnel noted.  Still has some tightness noted in the posterior cord.  Limited muscular skeletal ultrasound was performed and interpreted by Antoine Primas, M  No images saved today    Impression and Recommendations:    The above documentation has been reviewed and is accurate and complete Judi Saa, DO

## 2023-07-04 ENCOUNTER — Ambulatory Visit: Payer: Self-pay

## 2023-07-04 ENCOUNTER — Encounter: Payer: Self-pay | Admitting: Family Medicine

## 2023-07-04 ENCOUNTER — Ambulatory Visit (INDEPENDENT_AMBULATORY_CARE_PROVIDER_SITE_OTHER): Payer: BC Managed Care – PPO | Admitting: Family Medicine

## 2023-07-04 VITALS — BP 138/84 | Ht 66.0 in | Wt 215.0 lb

## 2023-07-04 VITALS — BP 116/76 | HR 74 | Ht 66.0 in | Wt 233.0 lb

## 2023-07-04 DIAGNOSIS — R269 Unspecified abnormalities of gait and mobility: Secondary | ICD-10-CM

## 2023-07-04 DIAGNOSIS — G5752 Tarsal tunnel syndrome, left lower limb: Secondary | ICD-10-CM

## 2023-07-04 DIAGNOSIS — G8929 Other chronic pain: Secondary | ICD-10-CM | POA: Diagnosis not present

## 2023-07-04 DIAGNOSIS — M79671 Pain in right foot: Secondary | ICD-10-CM | POA: Diagnosis not present

## 2023-07-04 DIAGNOSIS — M79672 Pain in left foot: Secondary | ICD-10-CM

## 2023-07-04 MED ORDER — DULOXETINE HCL 20 MG PO CPEP: 20.0000 mg | ORAL_CAPSULE | Freq: Every day | ORAL | 0 refills | Status: DC

## 2023-07-04 NOTE — Progress Notes (Signed)
DATE OF VISIT: 07/04/2023        Sharon West DOB: 07/26/1976 MRN: [id number]  CC:  Custom orthotics  History of present Illness: Sharon West is a 47 y.o. 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  Medications:  Outpatient Encounter Medications as of 07/04/2023  Medication Sig   DULoxetine (CYMBALTA) 20 MG capsule Take 1 capsule (20 mg total) by mouth daily.   levonorgestrel (MIRENA) 20 MCG/DAY IUD by Intrauterine route.   meloxicam (MOBIC) 15 MG tablet Take 1 tablet (15 mg total) by mouth daily.   NON FORMULARY Vitamin D 3, One tablet daily.   NON FORMULARY Ibuprofen as needed for pain in her feet.   No facility-administered encounter medications on file as of 07/04/2023.    Allergies: is allergic to penicillins.  Physical Examination: Vitals: BP 138/84   Ht 5\' 6"  (1.676 m)   Wt 215 lb (97.5 kg)   BMI 34.70 kg/m  GENERAL:  [name] is a 47 y.o. female appearing their stated age, alert and oriented x 3, in no apparent distress.  SKIN: no rashes or lesions, skin clean, dry, intact MSK: b/l feet with overpronation (Rt>Lt), transverse arch collapse with adduction of b/l 2nd toes, slight splaying between 2nd and 3rd toes b/l (Lt>Rt) Assessment & Plan Foot pain, bilateral Chronic b/l foot pain with transverse arch collapse and Lt-sided tarsal tunnel  PLAN: - note from Dr Katrinka Blazing reviewed from visit earlier today - fitted with custom orthotics with small MT pads today - they felt comfortable and was in more neutral position - f/u prn with [place], f/u with Dr Katrinka Blazing as directed Tarsal tunnel syndrome of left side Chronic b/l foot pain with transverse arch collapse and Lt-sided tarsal tunnel  PLAN: - note from Dr Katrinka Blazing reviewed from visit earlier today - fitted with custom orthotics with small MT  pads today - they felt comfortable and was in more neutral position - f/u prn with [place], f/u with Dr Katrinka Blazing as directed Abnormality of gait Chronic b/l foot pain with transverse arch collapse and Lt-sided tarsal tunnel, and gait abnormality with overpronation  PLAN: - note from Dr Katrinka Blazing reviewed from visit earlier today - fitted with custom orthotics with small MT pads today - they felt comfortable and was in more neutral position - f/u prn with [place], f/u with Dr Katrinka Blazing as directed  CUSTOM ORTHOTICS Patient was fitted for a : Fit 'n Run semi-rigid orthotic  The orthotic was heated, placed on the orthotic stand. The patient was positioned in subtalar neutral position and 10 degrees of ankle dorsiflexion in a weight bearing stance on the heated orthotic blank After completion of molding Blank: Fit 'n Run - size 8 Posting:  b/l small MT pads Base:  none Orthotics felt comfortable in the office and had more neutral alignment and improved gait    Patient expressed understanding & agreement with above.  Encounter Diagnoses  Name Primary?   Foot pain, bilateral Yes   Tarsal tunnel syndrome of left side    Abnormality of gait     No orders of the defined types were placed in this encounter.

## 2023-07-04 NOTE — Assessment & Plan Note (Addendum)
Do believe that this is likely more in the tarsal tunnel.  Started on Cymbalta.  Did respond somewhat to the injection and then with a EMG did have the radicular symptoms that is consistent with what we would find.  MRI did not show any significant bony abnormality and would like to continue to monitor otherwise.  I would like to get compartment testing as well done to further evaluate to see if there is any exertional compartment syndrome that could be potentially playing tennis and 1 reason why patient is not completely responding to conservative therapy.  Also with patient having some mild overpronation of the hindfoot I will get patient in custom orthotics to see if this would be beneficial as well.  We discussed icing regimen and home exercises, discussed which activities to do and which ones to avoid.  Can consider the possibility of further evaluation by orthopedic specialist but do not know if it would change management.  Follow-up with me again in 6 to 8 weeks

## 2023-07-04 NOTE — Patient Instructions (Signed)
Cymbalta 20mg   Referral for custom orthotics Exertional compartment testing Dr. Everardo Pacific  See me again before end of year

## 2023-07-04 NOTE — Assessment & Plan Note (Signed)
Chronic b/l foot pain with transverse arch collapse and Lt-sided tarsal tunnel  PLAN: - note from Dr Katrinka Blazing reviewed from visit earlier today - fitted with custom orthotics with small MT pads today - they felt comfortable and was in more neutral position - f/u prn with [place], f/u with Dr Katrinka Blazing as directed

## 2023-07-06 DIAGNOSIS — M25572 Pain in left ankle and joints of left foot: Secondary | ICD-10-CM | POA: Diagnosis not present

## 2023-07-11 DIAGNOSIS — M25572 Pain in left ankle and joints of left foot: Secondary | ICD-10-CM | POA: Diagnosis not present

## 2023-07-18 ENCOUNTER — Other Ambulatory Visit: Payer: Self-pay | Admitting: Family Medicine

## 2023-07-26 ENCOUNTER — Other Ambulatory Visit: Payer: Self-pay | Admitting: Family Medicine

## 2023-08-02 ENCOUNTER — Other Ambulatory Visit: Payer: Self-pay

## 2023-08-02 ENCOUNTER — Ambulatory Visit: Payer: BC Managed Care – PPO | Admitting: Family Medicine

## 2023-08-02 ENCOUNTER — Encounter: Payer: Self-pay | Admitting: Family Medicine

## 2023-08-02 VITALS — BP 124/84 | HR 99 | Ht 66.0 in | Wt 214.0 lb

## 2023-08-02 DIAGNOSIS — G5752 Tarsal tunnel syndrome, left lower limb: Secondary | ICD-10-CM

## 2023-08-02 DIAGNOSIS — M79672 Pain in left foot: Secondary | ICD-10-CM

## 2023-08-02 MED ORDER — DULOXETINE HCL 20 MG PO CPEP: 40.0000 mg | ORAL_CAPSULE | Freq: Every day | ORAL | 1 refills | Status: DC

## 2023-08-02 NOTE — Patient Instructions (Signed)
Increase Cymbalta to 40mg  Keep wearing oofos and replace frequently See you again in 3 months

## 2023-08-02 NOTE — Progress Notes (Unsigned)
[facility] [address] [place] [address] Phone: [phone] Subjective:   [facility], am serving as a scribe for Dr. Antoine Primas.  I'm seeing this patient by the request  of:  Farris Has, MD  CC: Left heel pain  [id number]  07/04/2023 Do believe that this is likely more in the tarsal tunnel.  Started on Cymbalta.  Did respond somewhat to the injection and then with a EMG did have the radicular symptoms that is consistent with what we would find.  MRI did not show any significant bony abnormality and would like to continue to monitor otherwise.  I would like to get compartment testing as well done to further evaluate to see if there is any exertional compartment syndrome that could be potentially playing tennis and 1 reason why patient is not completely responding to conservative therapy.  Also with patient having some mild overpronation of the hindfoot I will get patient in custom orthotics to see if this would be beneficial as well.  We discussed icing regimen and home exercises, discussed which activities to do and which ones to avoid.  Can consider the possibility of further evaluation by orthopedic specialist but do not know if it would change management.  Follow-up with me again in 6 to 8 weeks     Updated 08/02/2023 [facility] is a 47 y.o. female coming in with complaint of L heel pain. Still getting pain with activity. Has some other updates.       Past Medical History:  Diagnosis Date   Allergy    Arthritis    Rectocele    Vitamin D deficiency    Past Surgical History:  Procedure Laterality Date   CESAREAN SECTION     x 3   REPAIR ANKLE LIGAMENT Left    Social History   Socioeconomic History   Marital status: Married    Spouse name: Clifton Custard   Number of children: 3   Years of education: Not on file   Highest education level: Not on file  Occupational History   Not on file  Tobacco Use   Smoking status:  Never   Smokeless tobacco: Never  Vaping Use   Vaping status: Never Used  Substance and Sexual Activity   Alcohol use: Yes    Alcohol/week: 5.0 standard drinks of alcohol    Types: 5 Shots of liquor per week   Drug use: Never   Sexual activity: Yes    Birth control/protection: I.U.D.  Other Topics Concern   Not on file  Social History Narrative   Not on file   Social Drivers of Health   Financial Resource Strain: Not on file  Food Insecurity: Not on file  Transportation Needs: Not on file  Physical Activity: Not on file  Stress: Not on file  Social Connections: Not on file   Allergies  Allergen Reactions   Penicillins    Family History  Problem Relation Age of Onset   Colon polyps Mother    Cancer Mother        skin   Hypertension Mother    Colon cancer Maternal Grandmother 75   Esophageal cancer Neg Hx    Rectal cancer Neg Hx    Stomach cancer Neg Hx     Current Outpatient Medications (Endocrine & Metabolic):    levonorgestrel (MIRENA) 20 MCG/DAY IUD, by Intrauterine route.    Current Outpatient Medications (Analgesics):    meloxicam (MOBIC) 15 MG tablet, TAKE 1 TABLET (15  MG TOTAL) BY MOUTH DAILY.   Current Outpatient Medications (Other):    DULoxetine (CYMBALTA) 20 MG capsule, Take 2 capsules (40 mg total) by mouth daily.   NON FORMULARY, Vitamin D 3, One tablet daily.   NON FORMULARY, Ibuprofen as needed for pain in her feet.   Reviewed prior external information including notes and imaging from  primary care provider As well as notes that were available from care everywhere and other healthcare systems.  Past medical history, social, surgical and family history all reviewed in electronic medical record.  No pertanent information unless stated regarding to the chief complaint.   Review of Systems:  No headache, visual changes, nausea, vomiting, diarrhea, constipation, dizziness, abdominal pain, skin rash, fevers, chills, night sweats, weight loss,  swollen lymph nodes, body aches, joint swelling, chest pain, shortness of breath, mood changes. POSITIVE muscle aches  Objective  Blood pressure 124/84, pulse 99, height 5\' 6"  (1.676 m), weight 214 lb (97.1 kg), SpO2 97%.   General: No apparent distress alert and oriented x3 mood and affect normal, dressed appropriately.  HEENT: Pupils equal, extraocular movements intact  Respiratory: Patient's speak in full sentences and does not appear short of breath  Cardiovascular: No lower extremity edema, non tender, no erythema  Left ankle still has positive Tinel's over the tarsal tunnel.  Patient does have some discoloration noted on the dorsal aspect of the foot noted.  Some limited extension noted with dorsiflexion of the ankle  Limited muscular skeletal ultrasound was performed and interpreted by Antoine Primas, M  Patient still has some hypoechoic changes noted of the entire side of the contralateral side.  Hypoechoic changes in the fascicular aspects of the nerve noted.  Does have some dilatation compared to the contralateral side Impression: Still has some tarsal tunnel findings.    Impression and Recommendations:    The above documentation has been reviewed and is accurate and complete Judi Saa, DO

## 2023-08-03 ENCOUNTER — Encounter: Payer: Self-pay | Admitting: Family Medicine

## 2023-08-03 NOTE — Assessment & Plan Note (Signed)
Patient is making some strides at this time.  Discussed potentially into and considering the PRP at a future date.  Will hold on any other steroid injection.  Increased of the Cymbalta to 40 mg to see if she makes improvement.  Hopeful that patient will continue to do this.  Patient never got the exertional compartment syndrome testing done and do believe that we can continue to monitor.  Follow-up again in 2 to 3 months

## 2023-08-14 ENCOUNTER — Ambulatory Visit: Payer: BC Managed Care – PPO | Admitting: Family Medicine

## 2023-10-25 ENCOUNTER — Encounter: Payer: Self-pay | Admitting: Family Medicine

## 2023-10-30 ENCOUNTER — Ambulatory Visit: Payer: BC Managed Care – PPO | Admitting: Family Medicine

## 2023-11-01 ENCOUNTER — Ambulatory Visit: Payer: BC Managed Care – PPO | Admitting: Family Medicine

## 2023-11-15 ENCOUNTER — Encounter: Payer: Self-pay | Admitting: Family Medicine

## 2023-12-19 NOTE — Progress Notes (Signed)
 [facility] [address] [place] [address] Phone: [phone] Subjective:   [facility], am serving as a scribe for Dr. Ronnell Coins.  I'm seeing this patient by the request  of:  Ronna Coho, MD  CC: Left foot pain,  [id number]  08/02/2023 Patient is making some strides at this time.  Discussed potentially into and considering the PRP at a future date.  Will hold on any other steroid injection.  Increased of the Cymbalta  to 40 mg to see if she makes improvement.  Hopeful that patient will continue to do this.  Patient never got the exertional compartment syndrome testing done and do believe that we can continue to monitor.  Follow-up again in 2 to 3 months     Update 12/21/2023 [facility] is a 48 y.o. female coming in with complaint of L foot pain. Patient states able to tolerate activity a little more. Taking meloxicam  more as needed. Did some college tours. Able to wear shoe and sock now.      Past Medical History:  Diagnosis Date   Allergy    Arthritis    Rectocele    Vitamin D  deficiency    Past Surgical History:  Procedure Laterality Date   CESAREAN SECTION     x 3   REPAIR ANKLE LIGAMENT Left    Social History   Socioeconomic History   Marital status: Married    Spouse name: [facility]   Number of children: 3   Years of education: Not on file   Highest education level: Not on file  Occupational History   Not on file  Tobacco Use   Smoking status: Never   Smokeless tobacco: Never  Vaping Use   Vaping status: Never Used  Substance and Sexual Activity   Alcohol use: Yes    Alcohol/week: 5.0 standard drinks of alcohol    Types: 5 Shots of liquor per week   Drug use: Never   Sexual activity: Yes    Birth control/protection: I.U.D.  Other Topics Concern   Not on file  Social History Narrative   Not on file   Social Drivers of Health   Financial Resource Strain: Not on file  Food Insecurity: Not on file   Transportation Needs: Not on file  Physical Activity: Not on file  Stress: Not on file  Social Connections: Not on file   Allergies  Allergen Reactions   Penicillins    Family History  Problem Relation Age of Onset   Colon polyps Mother    Cancer Mother        skin   Hypertension Mother    Colon cancer Maternal Grandmother 90   Esophageal cancer Neg Hx    Rectal cancer Neg Hx    Stomach cancer Neg Hx     Current Outpatient Medications (Endocrine & Metabolic):    levonorgestrel  (MIRENA ) 20 MCG/DAY IUD, by Intrauterine route.    Current Outpatient Medications (Analgesics):    meloxicam  (MOBIC ) 15 MG tablet, Take 1 tablet (15 mg total) by mouth daily.   Current Outpatient Medications (Other):    DULoxetine  (CYMBALTA ) 20 MG capsule, Take 2 capsules (40 mg total) by mouth daily.   NON FORMULARY, Vitamin D  3, One tablet daily.   NON FORMULARY, Ibuprofen as needed for pain in her feet.   Reviewed prior external information including notes and imaging from  primary care provider As well as notes that were available from care everywhere and other healthcare systems.  Past medical history, social, surgical and family history all reviewed in electronic medical record.  No pertanent information unless stated regarding to the chief complaint.   Review of Systems:  No headache, visual changes, nausea, vomiting, diarrhea, constipation, dizziness, abdominal pain, skin rash, fevers, chills, night sweats, weight loss, swollen lymph nodes, body aches, joint swelling, chest pain, shortness of breath, mood changes. POSITIVE muscle aches  Objective  Blood pressure 124/84, pulse 84, height 5\' 6"  (1.676 m), weight 225 lb (102.1 kg), SpO2 97%.   General: No apparent distress alert and oriented x3 mood and affect normal, dressed appropriately.  HEENT: Pupils equal, extraocular movements intact  Respiratory: Patient's speak in full sentences and does not appear short of breath   Cardiovascular: No lower extremity edema, non tender, no erythema  Left foot no sign of mild tenderness over the tarsal tunnel area.  ATFL does have laxity noted.   Limited muscular skeletal ultrasound was performed and interpreted by Ronnell Coins, M  Limited ultrasound has significant decrease in the hypoechoic changes.  Fat pad and around the plantar nerve.  No significant hypoechoic changes of the tarsal tunnel noted.  Plantar fascia appears to be irregular amount of dilatation of the moment. Impression: Significant interval improvement   Impression and Recommendations:    The above documentation has been reviewed and is accurate and complete [name] M Feliberto Stockley, DO

## 2023-12-21 ENCOUNTER — Encounter: Payer: Self-pay | Admitting: Family Medicine

## 2023-12-21 ENCOUNTER — Other Ambulatory Visit: Payer: Self-pay

## 2023-12-21 ENCOUNTER — Ambulatory Visit: Admitting: Family Medicine

## 2023-12-21 VITALS — BP 124/84 | HR 84 | Ht 66.0 in | Wt 225.0 lb

## 2023-12-21 DIAGNOSIS — G5752 Tarsal tunnel syndrome, left lower limb: Secondary | ICD-10-CM | POA: Diagnosis not present

## 2023-12-21 DIAGNOSIS — M79672 Pain in left foot: Secondary | ICD-10-CM | POA: Diagnosis not present

## 2023-12-21 MED ORDER — MELOXICAM 15 MG PO TABS: 15.0000 mg | ORAL_TABLET | Freq: Every day | ORAL | 3 refills | Status: AC

## 2023-12-21 NOTE — Patient Instructions (Signed)
 Goal 210 in 2 months Water before meal No carbs after 6pm Refilled meloxicam  See you again in 2 months

## 2023-12-21 NOTE — Assessment & Plan Note (Signed)
 Significant improvement at this time.  Will hold on any other injection.  I do think that the instability on the lateral aspect of the ankle from the ATFL and the peroneal tendon can contribute to more compression of the sciatic the patient is doing much better with increasing activity.  Patient does not have any significant swelling noted over the plantar nerve or the tarsal tunnel at the moment.  Discussed with patient to continue the conservative therapy at this point.  We did discuss the importance of weight loss.  Would like to get patient's BMI less than 30.  Goal in 2 months will be down to 215 pounds.  Patient is in agreement.

## 2024-01-25 ENCOUNTER — Other Ambulatory Visit: Payer: Self-pay | Admitting: Family Medicine

## 2024-02-20 ENCOUNTER — Ambulatory Visit: Admitting: Family Medicine

## 2024-04-10 ENCOUNTER — Encounter (HOSPITAL_BASED_OUTPATIENT_CLINIC_OR_DEPARTMENT_OTHER): Payer: Self-pay | Admitting: Obstetrics & Gynecology

## 2024-04-16 ENCOUNTER — Ambulatory Visit: Admitting: Family Medicine

## 2024-05-01 ENCOUNTER — Encounter (HOSPITAL_BASED_OUTPATIENT_CLINIC_OR_DEPARTMENT_OTHER): Payer: Self-pay | Admitting: Obstetrics & Gynecology

## 2024-05-01 DIAGNOSIS — Z1231 Encounter for screening mammogram for malignant neoplasm of breast: Secondary | ICD-10-CM | POA: Diagnosis not present

## 2024-05-14 ENCOUNTER — Other Ambulatory Visit (HOSPITAL_BASED_OUTPATIENT_CLINIC_OR_DEPARTMENT_OTHER): Payer: Self-pay

## 2024-05-14 MED ORDER — FLUZONE 0.5 ML IM SUSY
0.5000 mL | PREFILLED_SYRINGE | Freq: Once | INTRAMUSCULAR | 0 refills | Status: AC
  Filled 2024-05-14: qty 0.5, 1d supply, fill #0

## 2024-05-22 ENCOUNTER — Encounter: Payer: Self-pay | Admitting: Family Medicine

## 2024-05-29 ENCOUNTER — Ambulatory Visit: Admitting: Family Medicine

## 2024-06-07 DIAGNOSIS — L821 Other seborrheic keratosis: Secondary | ICD-10-CM | POA: Diagnosis not present

## 2024-06-07 DIAGNOSIS — L570 Actinic keratosis: Secondary | ICD-10-CM | POA: Diagnosis not present

## 2024-06-07 DIAGNOSIS — L304 Erythema intertrigo: Secondary | ICD-10-CM | POA: Diagnosis not present

## 2024-06-07 DIAGNOSIS — D225 Melanocytic nevi of trunk: Secondary | ICD-10-CM | POA: Diagnosis not present

## 2024-06-07 DIAGNOSIS — L578 Other skin changes due to chronic exposure to nonionizing radiation: Secondary | ICD-10-CM | POA: Diagnosis not present

## 2024-06-17 DIAGNOSIS — R7309 Other abnormal glucose: Secondary | ICD-10-CM | POA: Diagnosis not present

## 2024-06-17 DIAGNOSIS — Z0000 Encounter for general adult medical examination without abnormal findings: Secondary | ICD-10-CM | POA: Diagnosis not present

## 2024-06-17 DIAGNOSIS — D649 Anemia, unspecified: Secondary | ICD-10-CM | POA: Diagnosis not present

## 2024-07-18 ENCOUNTER — Other Ambulatory Visit: Payer: Self-pay | Admitting: Family Medicine

## 2025-06-26 ENCOUNTER — Ambulatory Visit (HOSPITAL_BASED_OUTPATIENT_CLINIC_OR_DEPARTMENT_OTHER): Payer: BC Managed Care – PPO | Admitting: Obstetrics & Gynecology
